# Patient Record
Sex: Male | Born: 1990 | Race: Black or African American | Hispanic: No | Marital: Single | Smoking: Current every day smoker
Health system: Southern US, Community
[De-identification: ages and names within clinical notes are randomized; demographics above are authoritative.]

## PROBLEM LIST (undated history)

## (undated) DIAGNOSIS — I10 Essential (primary) hypertension: Secondary | ICD-10-CM

---
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
## 2010-10-22 ENCOUNTER — Encounter: Payer: Self-pay | Admitting: *Deleted

## 2014-05-19 ENCOUNTER — Emergency Department (INDEPENDENT_AMBULATORY_CARE_PROVIDER_SITE_OTHER)
Admission: EM | Admit: 2014-05-19 | Discharge: 2014-05-19 | Disposition: A | Discharge status: Home / Self Care | Payer: Self-pay | Source: Home / Self Care | Attending: Emergency Medicine | Admitting: Emergency Medicine

## 2014-05-19 ENCOUNTER — Encounter (HOSPITAL_COMMUNITY): Payer: Self-pay | Admitting: Emergency Medicine

## 2014-05-19 DIAGNOSIS — L237 Allergic contact dermatitis due to plants, except food: Secondary | ICD-10-CM

## 2014-05-19 DIAGNOSIS — L255 Unspecified contact dermatitis due to plants, except food: Secondary | ICD-10-CM

## 2014-05-19 MED ORDER — HYDROXYZINE HCL 25 MG PO TABS: 25.0000 mg | ORAL_TABLET | Freq: Four times a day (QID) | ORAL | Status: DC | PRN

## 2014-05-19 MED ORDER — PREDNISONE 20 MG PO TABS: ORAL_TABLET | ORAL | Status: DC

## 2014-05-19 MED ORDER — METHYLPREDNISOLONE ACETATE 80 MG/ML IJ SUSP
INTRAMUSCULAR | Status: AC
  Filled 2014-05-19: qty 1

## 2014-05-19 MED ORDER — TRIAMCINOLONE ACETONIDE 0.1 % EX CREA: TOPICAL_CREAM | CUTANEOUS | Status: DC

## 2014-05-19 MED ORDER — HYDROCORTISONE 1 % EX CREA: TOPICAL_CREAM | CUTANEOUS | Status: DC

## 2014-05-19 MED ORDER — METHYLPREDNISOLONE ACETATE 80 MG/ML IJ SUSP
80.0000 mg | Freq: Once | INTRAMUSCULAR | Status: AC
  Administered 2014-05-19: 80 mg via INTRAMUSCULAR

## 2014-05-19 NOTE — ED Provider Notes (Signed)
  Chief Complaint    Chief Complaint  Patient presents with  . Rash    History of Present Illness      Lance Williams is a 23 year old male who was exposed to poison ivy 4 days ago. 3 days ago he began to break out in a rash on his right arm and on his face. This is moderately pruritic. The rash on the face is around the eyes, although he has no problem with vision. He denies any fever, chills, difficulty breathing, or swelling of lips, tongue, or throat.  Review of Systems   Other than as noted above, the patient denies any of the following symptoms: Systemic:  No fever or chills. ENT:  No nasal congestion, rhinorrhea, sore throat, swelling of lips, tongue or throat. Resp:  No cough, wheezing, or shortness of breath.  PMFSH    Past medical history, family history, social history, meds, and allergies were reviewed. He has mild high blood pressure but is not taking any medication for it right now.  Physical Exam     Vital signs:  BP 149/92  Pulse 68  Temp(Src) 98.3 F (36.8 C) (Oral)  Resp 14  SpO2 100% Gen:  Alert, oriented, in no distress. ENT:  Pharynx clear, no intraoral lesions, moist mucous membranes. Lungs:  Clear to auscultation. Skin:  He has recent patches of maculopapules and vesicles on his right arm and on his face,he on the right upper and lower lids. Skin was otherwise clear.  Course in [facility]     The following meds were given:  Medications  methylPREDNISolone acetate (DEPO-MEDROL) injection 80 mg (not administered)   Assessment    The encounter diagnosis was Poison ivy.  Plan     1.  Meds:  The following meds were prescribed:   New Prescriptions   HYDROCORTISONE CREAM 1 %    Apply to rash on face TID   HYDROXYZINE (ATARAX/VISTARIL) 25 MG TABLET    Take 1 tablet (25 mg total) by mouth every 6 (six) hours as needed for itching.   PREDNISONE (DELTASONE) 20 MG TABLET    Take 3 daily for 5 days, 2 daily for 5 days, 1 daily for 5 days.   TRIAMCINOLONE CREAM (KENALOG) 0.1 %    Apply to rash on body TID    2.  Patient Education/Counseling:  The patient was given appropriate handouts, self care instructions, and instructed in symptomatic relief.    3.  Follow up:  The patient was told to follow up here if no better in 3 to 4 days, or sooner if becoming worse in any way, and given some red flag symptoms such as worsening rash, fever, or difficulty breathing which would prompt immediate return.  Follow up here if necessary.      Reuben Likes, MD 05/19/14 [phone]

## 2014-05-19 NOTE — Discharge Instructions (Signed)

## 2014-05-19 NOTE — ED Notes (Signed)
Rash on arms.  Noticed 1 wk ago.   No otc treatments tried.

## 2014-07-14 ENCOUNTER — Ambulatory Visit: Payer: Self-pay

## 2014-10-04 ENCOUNTER — Encounter (HOSPITAL_COMMUNITY): Payer: Self-pay | Admitting: Neurology

## 2014-10-04 ENCOUNTER — Emergency Department (HOSPITAL_COMMUNITY)
Admission: EM | Admit: 2014-10-04 | Discharge: 2014-10-04 | Disposition: A | Discharge status: Home / Self Care | Payer: Self-pay | Attending: Emergency Medicine | Admitting: Emergency Medicine

## 2014-10-04 DIAGNOSIS — Z87891 Personal history of nicotine dependence: Secondary | ICD-10-CM | POA: Insufficient documentation

## 2014-10-04 DIAGNOSIS — Z113 Encounter for screening for infections with a predominantly sexual mode of transmission: Secondary | ICD-10-CM

## 2014-10-04 DIAGNOSIS — J029 Acute pharyngitis, unspecified: Secondary | ICD-10-CM

## 2014-10-04 DIAGNOSIS — Z7982 Long term (current) use of aspirin: Secondary | ICD-10-CM | POA: Insufficient documentation

## 2014-10-04 LAB — RAPID STREP SCREEN (MED CTR MEBANE ONLY): Streptococcus, Group A Screen (Direct): NEGATIVE

## 2014-10-04 LAB — RAPID HIV SCREEN (WH-MAU): SUDS RAPID HIV SCREEN: NONREACTIVE

## 2014-10-04 LAB — MONONUCLEOSIS SCREEN: Mono Screen: NEGATIVE

## 2014-10-04 NOTE — ED Notes (Signed)
Lab in to draw blood.

## 2014-10-04 NOTE — ED Provider Notes (Signed)
CSN: [id number]     Arrival date & time 10/04/14  [id number] History   First MD Initiated Contact with Patient 10/04/14 1004     Chief Complaint  Patient presents with  . URI     (Consider location/radiation/quality/duration/timing/severity/associated sxs/prior Treatment) HPI Lance Williams is a 24 y.o. male with no medical problems, presents to emergency department complaining of sore throat. Patient reports sore throat for several days. States he feels like his tonsils are swollen. He has been taking aspirin and DayQuil with no relief. Patient also requesting STD screening. States he had intercourse with someone who tested positive for chlamydia 2 weeks ago, and he is worried he may have gotten HIV. They did not use protection. He denies any penile discharge, dysuria. He states he has had subjective fevers, denies any nasal congestion, cough. No recent ill contacts. Patient does not have a family doctor.  History reviewed. No pertinent past medical history. History reviewed. No pertinent past surgical history. No family history on file. History  Substance Use Topics  . Smoking status: Former Games developermoker  . Smokeless tobacco: Not on file  . Alcohol Use: No    Review of Systems  Constitutional: Negative for fever and chills.  HENT: Positive for sore throat. Negative for congestion.   Respiratory: Negative for cough, chest tightness and shortness of breath.   Cardiovascular: Negative for chest pain, palpitations and leg swelling.  Gastrointestinal: Negative for nausea, vomiting, abdominal pain, diarrhea and abdominal distention.  Genitourinary: Negative for dysuria, urgency, frequency, hematuria, discharge, penile swelling, penile pain and testicular pain.  Musculoskeletal: Negative for myalgias, arthralgias, neck pain and neck stiffness.  Skin: Negative for rash.  Allergic/Immunologic: Negative for immunocompromised state.  Neurological: Negative for weakness and headaches.       Allergies  Review of patient's allergies indicates no known allergies.  Home Medications   Prior to Admission medications   Medication Sig Start Date End Date Taking? Authorizing Provider  ASPIRIN PO Take 2 tablets by mouth daily as needed (pain).   Yes Historical Provider, MD  hydrocortisone cream 1 % Apply to rash on face TID Patient not taking: Reported on 10/04/2014 05/19/14   Reuben Likesavid C Keller, MD  hydrOXYzine (ATARAX/VISTARIL) 25 MG tablet Take 1 tablet (25 mg total) by mouth every 6 (six) hours as needed for itching. Patient not taking: Reported on 10/04/2014 05/19/14   Reuben Likesavid C Keller, MD  predniSONE (DELTASONE) 20 MG tablet Take 3 daily for 5 days, 2 daily for 5 days, 1 daily for 5 days. Patient not taking: Reported on 10/04/2014 05/19/14   Reuben Likesavid C Keller, MD  triamcinolone cream (KENALOG) 0.1 % Apply to rash on body TID Patient not taking: Reported on 10/04/2014 05/19/14   Reuben Likesavid C Keller, MD   BP 148/100 mmHg  Pulse 79  Temp(Src) 97.5 F (36.4 C) (Oral)  Resp 20  SpO2 99% Physical Exam  Constitutional: He is oriented to person, place, and time. He appears well-developed and well-nourished. No distress.  HENT:  Head: Normocephalic and atraumatic.  Right Ear: External ear normal.  Left Ear: External ear normal.  Mouth/Throat: Oropharynx is clear and moist.  Clear rhinorrhea, pharynx erythemous, tonsils enlarged, no exudate, uvula midline  Eyes: Conjunctivae are normal.  Neck: Normal range of motion. Neck supple.  No meningeal signs  Cardiovascular: Normal rate, regular rhythm and normal heart sounds.   Pulmonary/Chest: Effort normal and breath sounds normal. No respiratory distress. He has no wheezes. He has no rales.  Abdominal: Soft. Bowel sounds  are normal. There is no tenderness.  Genitourinary: Penis normal. No penile tenderness.  Musculoskeletal: He exhibits no edema or tenderness.  Lymphadenopathy:    He has no cervical adenopathy.  Neurological: He is alert and  oriented to person, place, and time.  Skin: Skin is warm and dry. No erythema.  Psychiatric: He has a normal mood and affect.  Nursing note and vitals reviewed.   ED Course  Procedures (including critical care time) Labs Review Labs Reviewed  RAPID STREP SCREEN  RPR  RAPID HIV SCREEN ([facility])  MONONUCLEOSIS SCREEN  GC/CHLAMYDIA PROBE AMP ([place])    Imaging Review No results found.   EKG Interpretation None      MDM   Final diagnoses:  Pharyngitis  Screening for STD (sexually transmitted disease)     patient is here with sore throat, once STD panel done. STD panel obtained including RPR, HIV, GC and chlamydia. Patient strep and Monospot are negative. Most likely viral pharyngitis. Will discharge home with dramatic treatment. No evidence of peritonsillar abscess. Vital signs are normal except for mildly elevated blood pressure, will discharge home with outpatient follow-up.  Filed Vitals:   10/04/14 1031 10/04/14 1115 10/04/14 1200 10/04/14 1243  BP: 148/100 138/101 148/98 148/98  Pulse: 79 74 75 79  Temp:    99.5 F (37.5 C)  TempSrc:    Oral  Resp:    16  SpO2: 99% 97% 99% 99%      Lottie Mussel, PA-C 10/04/14 1620  Rolan Bucco, MD 10/05/14 [phone]

## 2014-10-04 NOTE — Discharge Instructions (Signed)
Tylenol or motrin for pain. Salt water gargles. No intercourse. Follow up with your doctor or health dept for further tx and recheck. Your cultures for gonorrhea and clamydia are pending and if come back positive we will call you   Pharyngitis Pharyngitis is redness, pain, and swelling (inflammation) of your pharynx.  CAUSES  Pharyngitis is usually caused by infection. Most of the time, these infections are from viruses (viral) and are part of a cold. However, sometimes pharyngitis is caused by bacteria (bacterial). Pharyngitis can also be caused by allergies. Viral pharyngitis may be spread from person to person by coughing, sneezing, and personal items or utensils (cups, forks, spoons, toothbrushes). Bacterial pharyngitis may be spread from person to person by more intimate contact, such as kissing.  SIGNS AND SYMPTOMS  Symptoms of pharyngitis include:   Sore throat.   Tiredness (fatigue).   Low-grade fever.   Headache.  Joint pain and muscle aches.  Skin rashes.  Swollen lymph nodes.  Plaque-like film on throat or tonsils (often seen with bacterial pharyngitis). DIAGNOSIS  Your health care provider will ask you questions about your illness and your symptoms. Your medical history, along with a physical exam, is often all that is needed to diagnose pharyngitis. Sometimes, a rapid strep test is done. Other lab tests may also be done, depending on the suspected cause.  TREATMENT  Viral pharyngitis will usually get better in 3-4 days without the use of medicine. Bacterial pharyngitis is treated with medicines that kill germs (antibiotics).  HOME CARE INSTRUCTIONS   Drink enough water and fluids to keep your urine clear or pale yellow.   Only take over-the-counter or prescription medicines as directed by your health care provider:   If you are prescribed antibiotics, make sure you finish them even if you start to feel better.   Do not take aspirin.   Get lots of rest.    Gargle with 8 oz of salt water ( tsp of salt per 1 qt of water) as often as every 1-2 hours to soothe your throat.   Throat lozenges (if you are not at risk for choking) or sprays may be used to soothe your throat. SEEK MEDICAL CARE IF:   You have large, tender lumps in your neck.  You have a rash.  You cough up green, yellow-brown, or bloody spit. SEEK IMMEDIATE MEDICAL CARE IF:   Your neck becomes stiff.  You drool or are unable to swallow liquids.  You vomit or are unable to keep medicines or liquids down.  You have severe pain that does not go away with the use of recommended medicines.  You have trouble breathing (not caused by a stuffy nose). MAKE SURE YOU:   Understand these instructions.  Will watch your condition.  Will get help right away if you are not doing well or get worse. Document Released: 09/02/2005 Document Revised: 06/23/2013 Document Reviewed: 05/10/2013 [facility] Patient Information 2015 [place], [place]. This information is not intended to replace advice given to you by your health care provider. Make sure you discuss any questions you have with your health care provider.

## 2014-10-04 NOTE — ED Notes (Signed)
Complains of sore throat, sweating and exposure to chlamydia. States he thinks he may have some fever at home. Did not take temp. Denies n/v/d. States he is eating and drinking well. States his tonsils feel swollen and painful. He also is requesting to be hiv tested because he had unprotected sex with someone that had chlymidia about 2 weeks ago,, he denies any painful urination or penile discharge.

## 2014-10-04 NOTE — ED Notes (Signed)
Pt requesting HIV test, denies s/s  Just reports he wants the test. Also has cold, fever, swollen tonsils for several days. States BP has been high. Pt is a x 4.

## 2014-10-05 LAB — GC/CHLAMYDIA PROBE AMP (~~LOC~~) NOT AT ARMC
Chlamydia: NEGATIVE
NEISSERIA GONORRHEA: POSITIVE — AB

## 2014-10-05 LAB — RPR: RPR: NONREACTIVE

## 2014-10-06 LAB — CULTURE, GROUP A STREP

## 2014-10-07 ENCOUNTER — Emergency Department (HOSPITAL_COMMUNITY): Payer: Self-pay

## 2014-10-07 ENCOUNTER — Encounter (HOSPITAL_COMMUNITY): Payer: Self-pay

## 2014-10-07 ENCOUNTER — Emergency Department (HOSPITAL_COMMUNITY)
Admission: EM | Admit: 2014-10-07 | Discharge: 2014-10-07 | Disposition: A | Discharge status: Home / Self Care | Payer: Self-pay | Attending: Emergency Medicine | Admitting: Emergency Medicine

## 2014-10-07 DIAGNOSIS — J039 Acute tonsillitis, unspecified: Secondary | ICD-10-CM

## 2014-10-07 DIAGNOSIS — N289 Disorder of kidney and ureter, unspecified: Secondary | ICD-10-CM | POA: Insufficient documentation

## 2014-10-07 DIAGNOSIS — A549 Gonococcal infection, unspecified: Secondary | ICD-10-CM | POA: Insufficient documentation

## 2014-10-07 DIAGNOSIS — R61 Generalized hyperhidrosis: Secondary | ICD-10-CM | POA: Insufficient documentation

## 2014-10-07 DIAGNOSIS — R000 Tachycardia, unspecified: Secondary | ICD-10-CM | POA: Insufficient documentation

## 2014-10-07 DIAGNOSIS — J0300 Acute streptococcal tonsillitis, unspecified: Secondary | ICD-10-CM | POA: Insufficient documentation

## 2014-10-07 DIAGNOSIS — Z87891 Personal history of nicotine dependence: Secondary | ICD-10-CM | POA: Insufficient documentation

## 2014-10-07 DIAGNOSIS — Z7982 Long term (current) use of aspirin: Secondary | ICD-10-CM | POA: Insufficient documentation

## 2014-10-07 HISTORY — DX: Essential (primary) hypertension: I10

## 2014-10-07 LAB — I-STAT CHEM 8, ED
BUN: 15 mg/dL (ref 6–23)
CREATININE: 1.7 mg/dL — AB (ref 0.50–1.35)
Calcium, Ion: 1.11 mmol/L — ABNORMAL LOW (ref 1.12–1.23)
Chloride: 101 mmol/L (ref 96–112)
Glucose, Bld: 136 mg/dL — ABNORMAL HIGH (ref 70–99)
HEMATOCRIT: 52 % (ref 39.0–52.0)
Hemoglobin: 17.7 g/dL — ABNORMAL HIGH (ref 13.0–17.0)
Potassium: 4 mmol/L (ref 3.5–5.1)
Sodium: 139 mmol/L (ref 135–145)
TCO2: 24 mmol/L (ref 0–100)

## 2014-10-07 MED ORDER — PREDNISONE (PAK) 10 MG PO TABS: ORAL_TABLET | Freq: Every day | ORAL | Status: DC

## 2014-10-07 MED ORDER — IBUPROFEN 800 MG PO TABS: 800.0000 mg | ORAL_TABLET | Freq: Three times a day (TID) | ORAL | Status: DC | PRN

## 2014-10-07 MED ORDER — CEFTRIAXONE SODIUM 250 MG IJ SOLR
250.0000 mg | Freq: Once | INTRAMUSCULAR | Status: AC
  Administered 2014-10-07: 250 mg via INTRAMUSCULAR
  Filled 2014-10-07: qty 250

## 2014-10-07 MED ORDER — HYDROMORPHONE HCL 1 MG/ML IJ SOLN
1.0000 mg | Freq: Once | INTRAMUSCULAR | Status: AC
  Administered 2014-10-07: 1 mg via INTRAVENOUS
  Filled 2014-10-07: qty 1

## 2014-10-07 MED ORDER — PENICILLIN G BENZATHINE 1200000 UNIT/2ML IM SUSP
1.2000 10*6.[IU] | Freq: Once | INTRAMUSCULAR | Status: AC
  Administered 2014-10-07: 1.2 10*6.[IU] via INTRAMUSCULAR
  Filled 2014-10-07: qty 2

## 2014-10-07 MED ORDER — SODIUM CHLORIDE 0.9 % IV BOLUS (SEPSIS)
1000.0000 mL | Freq: Once | INTRAVENOUS | Status: AC
  Administered 2014-10-07: 1000 mL via INTRAVENOUS

## 2014-10-07 MED ORDER — IOHEXOL 300 MG/ML  SOLN
75.0000 mL | Freq: Once | INTRAMUSCULAR | Status: AC | PRN
  Administered 2014-10-07: 75 mL via INTRAVENOUS

## 2014-10-07 MED ORDER — PENICILLIN G BENZATHINE & PROC 1200000 UNIT/2ML IM SUSP
1.2000 10*6.[IU] | Freq: Once | INTRAMUSCULAR | Status: DC
  Filled 2014-10-07: qty 2

## 2014-10-07 MED ORDER — OXYCODONE-ACETAMINOPHEN 5-325 MG PO TABS: 1.0000 | ORAL_TABLET | ORAL | Status: DC | PRN

## 2014-10-07 MED ORDER — DEXAMETHASONE SODIUM PHOSPHATE 10 MG/ML IJ SOLN
10.0000 mg | Freq: Once | INTRAMUSCULAR | Status: AC
  Administered 2014-10-07: 10 mg via INTRAVENOUS
  Filled 2014-10-07: qty 1

## 2014-10-07 NOTE — Discharge Instructions (Signed)
Read the information below.  Use the prescribed medication as directed.  Please discuss all new medications with your pharmacist.  Do not take additional tylenol while taking the prescribed pain medication to avoid overdose.  You may return to the Emergency Department at any time for worsening condition or any new symptoms that concern you.  If you develop high fevers, difficulty swallowing or breathing, or you are unable to tolerate fluids by mouth, return to the ER immediately for a recheck.      Emergency Department Resource Guide 1) Find a Doctor and Pay Out of Pocket Although you won't have to find out who is covered by your insurance plan, it is a good idea to ask around and get recommendations. You will then need to call the office and see if the doctor you have chosen will accept you as a new patient and what types of options they offer for patients who are self-pay. Some doctors offer discounts or will set up payment plans for their patients who do not have insurance, but you will need to ask so you aren't surprised when you get to your appointment.  2) Contact Your Local Health Department Not all health departments have doctors that can see patients for sick visits, but many do, so it is worth a call to see if yours does. If you don't know where your local health department is, you can check in your phone book. The CDC also has a tool to help you locate your state's health department, and many state websites also have listings of all of their local health departments.  3) Find a Walk-in Clinic If your illness is not likely to be very severe or complicated, you may want to try a walk in clinic. These are popping up all over the country in pharmacies, drugstores, and shopping centers. They're usually staffed by nurse practitioners or physician assistants that have been trained to treat common illnesses and complaints. They're usually fairly quick and inexpensive. However, if you have serious  medical issues or chronic medical problems, these are probably not your best option.  No Primary Care Doctor: - Call Health Connect at  [phone] - they can help you locate a primary care doctor that  accepts your insurance, provides certain services, etc. - Physician Referral Service- [phone]  Chronic Pain Problems: Organization         Address  Phone   Notes  [facility]  [phone] Patients need to be referred by their primary care doctor.   Medication Assistance: Organization         Address  Phone   Notes  [facility] Medication [facility] [address] [place], [place] [address] [phone] --Must be a resident of [place] [facility] [place] -- Must have NO insurance coverage whatsoever (no Medicaid/ Medicare, etc.) -- The pt. MUST have a primary care doctor that directs their care regularly and follows them in the community   MedAssist  [phone]   [employer]  [phone]    Agencies that provide inexpensive medical care: Organization         Address  Phone   Notes  [facility]  [phone]   [facility]    [phone]   [facility] [address] [place], [place] [address] [phone]   [facility] [address], [place] [phone]   [facility]anned Parenthood    [phone]) [phone]  [facility]    [phone]   [facility]  [address], [place] Phone:  [phone], Fax:  [phone] Hours of Operation:  9 am - 6 pm, M-F.  Also accepts Medicaid/Medicare and self-pay.  [facility] for [place] [address], [address], [place] Phone: [phone], Fax: [phone]. Hours of Operation:  8:30 am - 5:30 pm, M-F.  Also accepts Medicaid and self-pay.  [facility] [place] [address] [place] Phone: [phone]   [place], [place], [place] [phone], Ext. [phone] Mondays & Thursdays: 7-9 AM.  First 15 patients are seen on a first come, first serve basis.    [place] Providers:  Organization         Address  Phone   Notes  [facility] [address], [place] [phone] Also accepts self-pay patients.  [facility] [id number] [place], [place]  [phone]   [place], [address], [place] [phone]   [facility] [address], [place] [phone]   Lucianne Lei [address], [place]   [phone] Only accepts [place] Access [place] patients after they have their name applied to their card.   Self-Pay (no insurance) in [facility]:  Organization         Address  Phone   Notes  Sickle Cell Patients, [facility] Internal [facility] [place] [phone]   [facility] [place] [phone]   [facility] [place]  [place], [place], [place] [phone]   [facility]/Dr. Osei-Bonsu  [address] [place] or [address], [place] [phone] Phone number for both [place] and [place] locations is the same.  Urgent Medical and [facility] [address], [place] [phone]   [facility] [address], [place] or [address] [phone]   [facility] [address] [place] [phone], phone; [phone], fax Sees patients 1st and 3rd Saturday of every month.  Must not qualify for public or private insurance (i.e. Medicaid, Medicare, [place] Health Choice, Veterans' Benefits)  Household income should be no more than 200% of the poverty level The clinic cannot treat you if you are pregnant or think you are pregnant  Sexually  transmitted diseases are not treated at the clinic.    Dental Care: Organization         Address  Phone  Notes  [facility] Department of [facility] [place] [phone] Accepts children up to age 77 who are enrolled in [place] or [place]; pregnant women with a Medicaid card; and children who have applied for Medicaid or [place] Health Choice, but were declined, whose parents can pay a reduced fee at time of service.  [facility] Department of [facility]  [address], [place] [phone] Accepts children up to age 66 who are enrolled in [place] or [place]; pregnant women with a Medicaid card; and children who have applied for Medicaid or [place] Health Choice, but were declined, whose parents can pay a reduced fee at time  of service.  [place] [facility] Access PROGRAM  [place] [phone] Patients are seen by appointment only. Walk-ins are not accepted. [place] will see patients 4 years of age and older. Monday - Tuesday (8am-5pm) Most Wednesdays (8:30-5pm) $30 per visit, cash only  [facility] Adult Dental Access PROGRAM  [address], [facility] [phone] Patients are seen by appointment only. Walk-ins are not accepted. [place] will see patients 33 years of age and older. One Wednesday Evening (Monthly: Volunteer Based).  $30 per visit, cash only  [place]  [phone] for adults; Children under age 60, call Graduate Pediatric Dentistry at [phone]. Children aged 7-14, please call [phone] to request a pediatric application.  Dental services are provided in all areas of dental care including fillings, crowns and bridges, complete and partial dentures, implants, gum treatment, root canals, and extractions. Preventive care is also provided. Treatment is provided to both adults and children. Patients are  selected via a lottery and there is often a waiting list.   [facility] [address] [place]  [phone] [email or web address]   [facility] [address] [place], [place] [phone], Ext. [phone] Second and Fourth Thursday of each month, opens at 6:30 AM; Clinic ends at 9 AM.  Patients are seen on a first-come first-served basis, and a limited number are seen during each clinic.   [facility]  [address] Hillard Danker [place], [place] [phone]   Eligibility Requirements You must have lived in [place], [place], or [place] counties for at least the last three months.   You cannot be eligible for state or federal sponsored [employer], including [employer], [place], or [employer].   You generally cannot be eligible for healthcare insurance through your employer.    How to apply: Eligibility screenings are held every Tuesday and Wednesday afternoon from 1:00 pm until 4:00 pm. You do not need an appointment for the interview!  [facility] [address] [place], [place]   [place]  [place] Department  [place]  [phone]    Behavioral Health Resources in the Community: Intensive Outpatient Programs Organization         Address  Phone  Notes  [place]. [address] [place], [place] [phone]   [facility] [address] [place], [place]   ADS: Alcohol & Drug Svcs [address] [place], [place]   [place] [address]  [place], [place] or [phone]   Substance Abuse Resources Organization         Address  Phone  Notes  Alcohol and Drug Services  [phone]   [place]  [phone]   The [place]   Chinita Pester  [phone]     Residential & Outpatient Substance Abuse Program  [phone]   Psychological Services Organization         Address  Phone  Notes  [facility]  [place]  [phone]   [place] [address], [place] or [phone]    Mobile Crisis Teams Organization         Address  Phone  Notes  [facility], Mobile Crisis Care Unit  [phone]   Assertive  Psychotherapeutic Services  [address] [place], [place]   [facility] [address], [place] [phone]    Self-Help/Support Groups Organization         Address  Phone             Notes  Mental Health Assoc. of [place] - variety of support groups  [place] Call for more information  Narcotics Anonymous (NA), [facility]aring [facility] [address], [employer] [place]  2 meetings at this location   Special educational needs teacher         Address  Phone  Notes  ASAP Residential Treatment [place],    [place]  [phone]   [facility]  [address], [place] [phone], [place], [place]   [place], [place] [phone] Admissions: 8am-3pm M-F  Incentives Substance Jadence [place] [address],    [place], [place] [phone]   [facility] [address] [place], [place], [place]   [facility] [address]  [place], [place]   [facility] - Intensive Outpatient [address]een [address], [place], [place]   [facility] ([place].,  [place], [place] [phone] or [phone]   [facility] Services (RTS) [address] [place], [place] Accepts Medicaid  [facility] [place] [address]  [place] [phone] Substance Abuse/Addiction Treatment   [facility] Organization         Address  Phone  Notes  [facility]  [phone]   Domenic Schwab, PhD [address] Arlis [place], [place]   [phone] or [phone]   [place], [place] [phone]   [facility] [address] [place], [place] [phone] Insurance/Medicaid/sponsorship through [facility] and Families [address] [place]                                    [employer], [place] [phone] [place] [address] [place], [place] [phone]    Dr. Adele Schilder  [phone]   [facility] Dept. 1) [address] [place] 2) [place] 3)  [place] [address], [place] [phone]  [phone]   [place] [phone] or [phone] (After Hours)

## 2014-10-07 NOTE — ED Provider Notes (Signed)
CSN: [id number]     Arrival date & time 10/07/14  1234 History   First MD Initiated Contact with Patient 10/07/14 1249     No chief complaint on file.    (Consider location/radiation/quality/duration/timing/severity/associated sxs/prior Treatment) HPI   Pt seen in ED 3 days ago for sore throat and STD screening p/w worsening sore throat.  Has been taking tylenol and doing salt water gargles without improvement.  Has now been sick for 1-1.5 weeks and is having difficulty eating and drinking due to pain.  Pain is constant, sharp, 9/10, worse with swallowing.  Associated chills/sweats.  He was screened for STDs because he had unprotected sex with a new partner - states he did engage oral sex with this person approximately 2 weeks ago.  Found to be positive for gonorrhea, not yet treated.  Strep culture is positive, has not yet been treated.  Denies cough, SOB, difficulty breathing or swallowing, N/V, abdominal pain, penile discharge, testicular pain or swelling.    History reviewed. No pertinent past medical history. History reviewed. No pertinent past surgical history. History reviewed. No pertinent family history. History  Substance Use Topics  . Smoking status: Former Games developer  . Smokeless tobacco: Not on file  . Alcohol Use: No    Review of Systems  Constitutional: Positive for fever, chills and diaphoresis.  HENT: Positive for sore throat and voice change. Negative for trouble swallowing.   Respiratory: Negative for shortness of breath.   Allergic/Immunologic: Negative for immunocompromised state.  All other systems reviewed and are negative.     Allergies  Review of patient's allergies indicates no known allergies.  Home Medications   Prior to Admission medications   Medication Sig Start Date End Date Taking? Authorizing Provider  ASPIRIN PO Take 2 tablets by mouth daily as needed (pain).    Historical Provider, MD  hydrocortisone cream 1 % Apply to rash on face TID Patient  not taking: Reported on 10/04/2014 05/19/14   Reuben Likes, MD  hydrOXYzine (ATARAX/VISTARIL) 25 MG tablet Take 1 tablet (25 mg total) by mouth every 6 (six) hours as needed for itching. Patient not taking: Reported on 10/04/2014 05/19/14   Reuben Likes, MD  predniSONE (DELTASONE) 20 MG tablet Take 3 daily for 5 days, 2 daily for 5 days, 1 daily for 5 days. Patient not taking: Reported on 10/04/2014 05/19/14   Reuben Likes, MD  triamcinolone cream (KENALOG) 0.1 % Apply to rash on body TID Patient not taking: Reported on 10/04/2014 05/19/14   Reuben Likes, MD   BP 157/86 mmHg  Pulse 115  Temp(Src) 99.2 F (37.3 C) (Oral)  Resp 15  Ht  (1.753 m)  SpO2 97% Physical Exam  Constitutional: He appears well-developed and well-nourished. No distress.  HENT:  Head: Normocephalic and atraumatic.  Mouth/Throat: Uvula is midline. Mucous membranes are not dry. No uvula swelling. Oropharyngeal exudate, posterior oropharyngeal edema and posterior oropharyngeal erythema present.  Bilateral tonsils enlarged, asymmetric, R>L Tolerating oral secretions without difficulty +voice change  Eyes: Conjunctivae are normal.  Neck: Normal range of motion. Neck supple.  Cardiovascular: Regular rhythm.  Tachycardia present.   Pulmonary/Chest: Effort normal and breath sounds normal. No stridor. No respiratory distress. He has no wheezes. He has no rales.  Abdominal: Soft. He exhibits no distension. There is no tenderness. There is no rebound and no guarding.  Lymphadenopathy:    He has cervical adenopathy.  Neurological: He is alert.  Skin: He is not diaphoretic.  Psychiatric: He  has a normal mood and affect. His behavior is normal.  Nursing note and vitals reviewed.   ED Course  Procedures (including critical care time) Labs Review Labs Reviewed  I-STAT CHEM 8, ED - Abnormal; Notable for the following:    Creatinine, Ser 1.70 (*)    Glucose, Bld 136 (*)    Calcium, Ion 1.11 (*)    Hemoglobin 17.7 (*)     All other components within normal limits    Imaging Review Ct Soft Tissue Neck W Contrast  10/07/2014   CLINICAL DATA:  24 year old male with sore throat and dysphagia  EXAM: CT NECK WITH CONTRAST  TECHNIQUE: Multidetector CT imaging of the neck was performed using the standard protocol following the bolus administration of intravenous contrast.  CONTRAST:  75mL OMNIPAQUE IOHEXOL 300 MG/ML  SOLN  COMPARISON:  None.  FINDINGS: Pharynx and larynx: The larynx is unremarkable. Evaluation of the pharynx demonstrates marked hypertrophy of the bilateral faucial tonsils. Small speckled areas of low attenuation throughout both tonsils consistent with suppurative tonsillitis. The largest individual fluid collection is located on the left and measures 13 x 12 mm.  Salivary glands: Within normal limits.  Thyroid: Within normal limits.  Lymph nodes: Mild bilateral reactive cervical adenopathy. No suspicious lymph nodes identified.  Vascular: Within normal limits.  Limited intracranial: Unremarkable.  Mastoids and visualized paranasal sinuses: Mild mucoperiosteal thickening in the inferior aspect of the left maxillary sinus. Otherwise, unremarkable.  Skeleton: Within normal limits.  Upper chest: Unremarkable.  IMPRESSION: 1. The CT findings are most consistent with acute suppurative tonsillitis. There are multiple small low attenuation collections throughout both tonsils consistent with diffuse phlegmon or tiny abscesses. The largest single fluid collection on the left measures 13 x 12 mm. 2. Likely reactive bilateral cervical adenopathy.   Electronically Signed   By: Malachy Moan M.D.   On: 10/07/2014 14:01     EKG Interpretation None       I have discussed the patient and CT scan with Dr Madilyn Hook who has also seen and examined the patient.  I have spoken with the ED pharmacists regarding treatment of gonococcal pharyngitis, they will consult the ID pharmacist.    2:36 PM Discussed pt with Dr Lazarus Salines who will  review the CT and return my call.    Dr Lazarus Salines recommends treating as tonsillitis with antibiotics.  No need for surgical intervention at this time.    3:54 PM Pt reports feeling much better after IVF, pain medications ,decadron, antibiotics.  Tolerating PO easily.  Discussed liquid vs pill pain medications for home and pt prefers pills (no current insurance).    MDM   Final diagnoses:  Acute suppurative tonsillitis  Renal insufficiency  Gonorrhea  Strep tonsillitis    Afebrile, nontoxic patient with pharyngitis with subjective fevers, erythema, edema, exudate, asymmetric tonsils.  Recent positive gonorrhea test and pt did engage in oral sex with this encounter.  Treated for gonorrhea with IM rocephin.  Recent results also show culture positive for strep.  Given IVF, pain medication.  Pt tolerating oral secretions without difficulty.  Mono test negative.  STD/HIV tests otherwise negative.  CT neck w contrast shows suppurative tonsillitis with diffuse phlegmon and tiny abscesses, largest measuring 12x4mm.  Discussed patient and CT results with Dr Lazarus Salines who recommends treatment as tonsillitis with antibiotics.  Discussed treatment of possible gonococcal pharyngitis given timing of symptoms and ID pharmacist recommended treating as strep pharyngitis and treating for gonorrhea infection with standard treatment.   Pt advised  all sexual partners should be tested and treated.  D/C home with percocet, prednisone, ibuprofen.  PCP resources for follow up to recheck kidney function.  ENT follow up.  Discussed strict return precautions.  Discussed result, findings, treatment, and follow up  with patient.  Pt given return precautions.  Pt verbalizes understanding and agrees with plan.         Trixie Dredgemily Rickesha Veracruz, PA-C 10/07/14 1555  Tilden FossaElizabeth Rees, MD 10/10/14 [phone]

## 2014-10-07 NOTE — ED Notes (Signed)
Pt presents with continued sore throat x 1 week.  Pt seen here for same on 1/19, reports inability to eat or drink.  Pt requesting abx.

## 2014-10-13 ENCOUNTER — Telehealth (HOSPITAL_BASED_OUTPATIENT_CLINIC_OR_DEPARTMENT_OTHER): Payer: Self-pay | Admitting: Emergency Medicine

## 2014-10-13 NOTE — Telephone Encounter (Signed)
Post ED Visit - Positive Culture Follow-up: Chart Hand-off to ED Flow Manager  Culture assessed and recommendations reviewed by: []  Wes Dulaney, Pharm.D., BCPS []  Celedonio MiyamotoJeremy Frens, Pharm.D., BCPS []  Georgina PillionElizabeth Martin, Pharm.D., BCPS []  [place] Lake[place]h Pham, [address] BoulevardPharm.D., BCPS, AAHIVP []  Estella HuskMichelle Turner, Pharm .D., BCPS, AAHIVP []  Babs BertinHaley Baird, [address] BoulevardPharm.D.  Positive GC  culture  []  Patient discharged without antimicrobial prescription and treatment is now indicated [x]  Organism is resistant to prescribed ED discharge antimicrobial []  Patient with positive blood cultures  Chart handoff to EDP 10/13/14   Berle MullMiller, Mckala Pantaleon 10/13/2014, 4:48 PM

## 2014-10-18 ENCOUNTER — Telehealth (HOSPITAL_BASED_OUTPATIENT_CLINIC_OR_DEPARTMENT_OTHER): Payer: Self-pay | Admitting: Emergency Medicine

## 2014-10-31 ENCOUNTER — Telehealth (HOSPITAL_COMMUNITY): Payer: Self-pay

## 2014-10-31 NOTE — ED Notes (Signed)
Unable to contact pt by mail or telephone. Unable to communicate lab results or treatment changes. 

## 2016-02-06 ENCOUNTER — Emergency Department (HOSPITAL_COMMUNITY)
Admission: EM | Admit: 2016-02-06 | Discharge: 2016-02-06 | Disposition: A | Discharge status: Home / Self Care | Payer: Self-pay | Attending: Emergency Medicine | Admitting: Emergency Medicine

## 2016-02-06 ENCOUNTER — Encounter (HOSPITAL_COMMUNITY): Payer: Self-pay | Admitting: *Deleted

## 2016-02-06 DIAGNOSIS — Z79899 Other long term (current) drug therapy: Secondary | ICD-10-CM | POA: Insufficient documentation

## 2016-02-06 DIAGNOSIS — Z7952 Long term (current) use of systemic steroids: Secondary | ICD-10-CM | POA: Insufficient documentation

## 2016-02-06 DIAGNOSIS — Z202 Contact with and (suspected) exposure to infections with a predominantly sexual mode of transmission: Secondary | ICD-10-CM | POA: Insufficient documentation

## 2016-02-06 DIAGNOSIS — N342 Other urethritis: Secondary | ICD-10-CM | POA: Insufficient documentation

## 2016-02-06 DIAGNOSIS — Z7982 Long term (current) use of aspirin: Secondary | ICD-10-CM | POA: Insufficient documentation

## 2016-02-06 DIAGNOSIS — I10 Essential (primary) hypertension: Secondary | ICD-10-CM | POA: Insufficient documentation

## 2016-02-06 DIAGNOSIS — Z87891 Personal history of nicotine dependence: Secondary | ICD-10-CM | POA: Insufficient documentation

## 2016-02-06 MED ORDER — STERILE WATER FOR INJECTION IJ SOLN
INTRAMUSCULAR | Status: AC
  Administered 2016-02-06: 10 mL
  Filled 2016-02-06: qty 10

## 2016-02-06 MED ORDER — CEFTRIAXONE SODIUM 250 MG IJ SOLR
250.0000 mg | Freq: Once | INTRAMUSCULAR | Status: AC
  Administered 2016-02-06: 250 mg via INTRAMUSCULAR
  Filled 2016-02-06: qty 250

## 2016-02-06 MED ORDER — AZITHROMYCIN 250 MG PO TABS
1000.0000 mg | ORAL_TABLET | Freq: Once | ORAL | Status: AC
  Administered 2016-02-06: 1000 mg via ORAL
  Filled 2016-02-06: qty 4

## 2016-02-06 NOTE — ED Notes (Signed)
Pt is in stable condition upon d/c and ambulates from ED. 

## 2016-02-06 NOTE — Discharge Instructions (Signed)
Sexually Transmitted Disease °A sexually transmitted disease (STD) is a disease or infection that may be passed (transmitted) from person to person, usually during sexual activity. This may happen by way of saliva, semen, blood, vaginal mucus, or urine. Common STDs include: °· Gonorrhea. °· Chlamydia. °· Syphilis. °· HIV and AIDS. °· Genital herpes. °· Hepatitis B and C. °· Trichomonas. °· Human papillomavirus (HPV). °· Pubic lice. °· Scabies. °· Mites. °· Bacterial vaginosis. °WHAT ARE CAUSES OF STDs? °An STD may be caused by bacteria, a virus, or parasites. STDs are often transmitted during sexual activity if one person is infected. However, they may also be transmitted through nonsexual means. STDs may be transmitted after:  °· Sexual intercourse with an infected person. °· Sharing sex toys with an infected person. °· Sharing needles with an infected person or using unclean piercing or tattoo needles. °· Having intimate contact with the genitals, mouth, or rectal areas of an infected person. °· Exposure to infected fluids during birth. °WHAT ARE THE SIGNS AND SYMPTOMS OF STDs? °Different STDs have different symptoms. Some people may not have any symptoms. If symptoms are present, they may include: °· Painful or bloody urination. °· Pain in the pelvis, abdomen, vagina, anus, throat, or eyes. °· A skin rash, itching, or irritation. °· Growths, ulcerations, blisters, or sores in the genital and anal areas. °· Abnormal vaginal discharge with or without bad odor. °· Penile discharge in men. °· Fever. °· Pain or bleeding during sexual intercourse. °· Swollen glands in the groin area. °· Yellow skin and eyes (jaundice). This is seen with hepatitis. °· Swollen testicles. °· Infertility. °· Sores and blisters in the mouth. °HOW ARE STDs DIAGNOSED? °To make a diagnosis, your health care provider may: °· Take a medical history. °· Perform a physical exam. °· Take a sample of any discharge to examine. °· Swab the throat,  cervix, opening to the penis, rectum, or vagina for testing. °· Test a sample of your first morning urine. °· Perform blood tests. °· Perform a Pap test, if this applies. °· Perform a colposcopy. °· Perform a laparoscopy. °HOW ARE STDs TREATED? °Treatment depends on the STD. Some STDs may be treated but not cured. °· Chlamydia, gonorrhea, trichomonas, and syphilis can be cured with antibiotic medicine. °· Genital herpes, hepatitis, and HIV can be treated, but not cured, with prescribed medicines. The medicines lessen symptoms. °· Genital warts from HPV can be treated with medicine or by freezing, burning (electrocautery), or surgery. Warts may come back. °· HPV cannot be cured with medicine or surgery. However, abnormal areas may be removed from the cervix, vagina, or vulva. °· If your diagnosis is confirmed, your recent sexual partners need treatment. This is true even if they are symptom-free or have a negative culture or evaluation. They should not have sex until their health care providers say it is okay. °· Your health care provider may test you for infection again 3 months after treatment. °HOW CAN I REDUCE MY RISK OF GETTING AN STD? °Take these steps to reduce your risk of getting an STD: °· Use latex condoms, dental dams, and water-soluble lubricants during sexual activity. Do not use petroleum jelly or oils. °· Avoid having multiple sex partners. °· Do not have sex with someone who has other sex partners °· Do not have sex with anyone you do not know or who is at high risk for an STD. °· Avoid risky sex practices that can break your skin. °· Do not have sex   if you have open sores on your mouth or skin.  Avoid drinking too much alcohol or taking illegal drugs. Alcohol and drugs can affect your judgment and put you in a vulnerable position.  Avoid engaging in oral and anal sex acts.  Get vaccinated for HPV and hepatitis. If you have not received these vaccines in the past, talk to your health care  provider about whether one or both might be right for you.  If you are at risk of being infected with HIV, it is recommended that you take a prescription medicine daily to prevent HIV infection. This is called pre-exposure prophylaxis (PrEP). You are considered at risk if:  You are a man who has sex with other men (MSM).  You are a heterosexual man or woman and are sexually active with more than one partner.  You take drugs by injection.  You are sexually active with a partner who has HIV.  Talk with your health care provider about whether you are at high risk of being infected with HIV. If you choose to begin PrEP, you should first be tested for HIV. You should then be tested every 3 months for as long as you are taking PrEP. WHAT SHOULD I DO IF I THINK I HAVE AN STD?  See your health care provider.  Tell your sexual partner(s). They should be tested and treated for any STDs.  Do not have sex until your health care provider says it is okay. WHEN SHOULD I GET IMMEDIATE MEDICAL CARE? Contact your health care provider right away if:   You have severe abdominal pain.  You are a man and notice swelling or pain in your testicles.  You are a woman and notice swelling or pain in your vagina.   This information is not intended to replace advice given to you by your health care provider. Make sure you discuss any questions you have with your health care provider.   Document Released: 11/23/2002 Document Revised: 09/23/2014 Document Reviewed: 03/23/2013 Elsevier Interactive Patient Education 2016 Else[employer] Inc[employer]  Urethritis, Adult Urethritis is an inflammation of the tube through which urine exits your bladder (urethra).  CAUSES Urethritis is often caused by an infection in your urethra. The infection can be viral, like herpes. The infection can also be bacterial, like gonorrhea. RISK FACTORS Risk factors of urethritis include:  Having sex without using a condom.  Having multiple  sexual partners.  Having poor hygiene. SIGNS AND SYMPTOMS Symptoms of urethritis are less noticeable in women than in men. These symptoms include:  Burning feeling when you urinate (dysuria).  Discharge from your urethra.  Blood in your urine (hematuria).  Urinating more than usual. DIAGNOSIS  To confirm a diagnosis of urethritis, your health care provider will do the following:  Ask about your sexual history.  Perform a physical exam.  Have you provide a sample of your urine for lab testing.  Use a cotton swab to gently collect a sample from your urethra for lab testing. TREATMENT  It is important to treat urethritis. Depending on the cause, untreated urethritis may lead to serious genital infections and possibly infertility. Urethritis caused by a bacterial infection is treated with antibiotic medicine. All sexual partners must be treated.  HOME CARE INSTRUCTIONS  Do not have sex until the test results are known and treatment is completed, even if your symptoms go away before you finish treatment.  If you were prescribed an antibiotic, finish it all even if you start to feel better. SEEK  MEDICAL CARE IF:   Your symptoms are not improved in 3 days.  Your symptoms are getting worse.  You develop abdominal pain or pelvic pain (in women).  You develop joint pain.  You have a fever. SEEK IMMEDIATE MEDICAL CARE IF:   You have severe pain in the belly, back, or side.  You have repeated vomiting. MAKE SURE YOU:  Understand these instructions.  Will watch your condition.  Will get help right away if you are not doing well or get worse.   This information is not intended to replace advice given to you by your health care provider. Make sure you discuss any questions you have with your health care provider.   Document Released: 02/26/2001 Document Revised: 01/17/2015 Document Reviewed: 05/03/2013 Elsevier Interactive Patient Education [employer]  Sexually  Transmitted Disease A sexually transmitted disease (STD) is a disease or infection often passed to another person during sex. However, STDs can be passed through nonsexual ways. An STD can be passed through:  Spit (saliva).  Semen.  Blood.  Mucus from the vagina.  Pee (urine). HOW CAN I LESSEN MY CHANCES OF GETTING AN STD?  Use:  Latex condoms.  Water-soluble lubricants with condoms. Do not use petroleum jelly or oils.  Dental dams. These are small pieces of latex that are used as a barrier during oral sex.  Avoid having more than one sex partner.  Do not have sex with someone who has other sex partners.  Do not have sex with anyone you do not know or who is at high risk for an STD.  Avoid risky sex that can break your skin.  Do not have sex if you have open sores on your mouth or skin.  Avoid drinking too much alcohol or taking illegal drugs. Alcohol and drugs can affect your good judgment.  Avoid oral and anal sex acts.  Get shots (vaccines) for HPV and hepatitis.  If you are at risk of being infected with HIV, it is advised that you take a certain medicine daily to prevent HIV infection. This is called pre-exposure prophylaxis (PrEP). You may be at risk if:  You are a man who has sex with other men (MSM).  You are attracted to the opposite sex (heterosexual) and are having sex with more than one partner.  You take drugs with a needle.  You have sex with someone who has HIV.  Talk with your doctor about if you are at high risk of being infected with HIV. If you begin to take PrEP, get tested for HIV first. Get tested every 3 months for as long as you are taking PrEP.  Get tested for STDs every year if you are sexually active. If you are treated for an STD, get tested again 3 months after you are treated. WHAT SHOULD I DO IF I THINK I HAVE AN STD?  See your doctor.  Tell your sex partner(s) that you have an STD. They should be tested and treated.  Do not have  sex until your doctor says it is okay. WHEN SHOULD I GET HELP? Get help right away if: Safe Sex Safe sex is about reducing the risk of giving or getting a sexually transmitted disease (STD). STDs are spread through sexual contact involving the genitals, mouth, or rectum. Some STDs can be cured and others cannot. Safe sex can also prevent unintended pregnancies.  WHAT ARE SOME SAFE SEX PRACTICES?  Limit your sexual activity to only one partner who is having  sex with only you.  Talk to your partner about his or her past partners, past STDs, and drug use.  Use a condom every time you have sexual intercourse. This includes vaginal, oral, and anal sexual activity. Both females and males should wear condoms during oral sex. Only use latex or polyurethane condoms and water-based lubricants. Using petroleum-based lubricants or oils to lubricate a condom will weaken the condom and increase the chance that it will break. The condom should be in place from the beginning to the end of sexual activity. Wearing a condom reduces, but does not completely eliminate, your risk of getting or giving an STD. STDs can be spread by contact with infected body fluids and skin.  Get vaccinated for hepatitis B and HPV.  Avoid alcohol and recreational drugs, which can affect your judgment. You may forget to use a condom or participate in high-risk sex.  For females, avoid douching after sexual intercourse. Douching can spread an infection farther into the reproductive tract.  Check your body for signs of sores, blisters, rashes, or unusual discharge. See your health care provider if you notice any of these signs.  Avoid sexual contact if you have symptoms of an infection or are being treated for an STD. If you or your partner has herpes, avoid sexual contact when blisters are present. Use condoms at all other times.  If you are at risk of being infected with HIV, it is recommended that you take a prescription medicine  daily to prevent HIV infection. This is called pre-exposure prophylaxis (PrEP). You are considered at risk if:  You are a man who has sex with other men (MSM).  You are a heterosexual man or woman who is sexually active with more than one partner.  You take drugs by injection.  You are sexually active with a partner who has HIV.  Talk with your health care provider about whether you are at high risk of being infected with HIV. If you choose to begin PrEP, you should first be tested for HIV. You should then be tested every 3 months for as long as you are taking PrEP.  See your health care provider for regular screenings, exams, and tests for other STDs. Before having sex with a new partner, each of you should be screened for STDs and should talk about the results with each other. WHAT ARE THE BENEFITS OF SAFE SEX?   There is less chance of getting or giving an STD.  You can prevent unwanted or unintended pregnancies.  By discussing safe sex concerns with your partner, you may increase feelings of intimacy, comfort, trust, and honesty between the two of you.   This information is not intended to replace advice given to you by your health care provider. Make sure you discuss any questions you have with your health care provider.   Document Released: 10/10/2004 Document Revised: 09/23/2014 Document Reviewed: 02/24/2012 Elsevier Interactive Patient Education 2016 Elsevier Inc.   You have bad belly (abdominal) pain.  You are a man and have puffiness (swelling) or pain in your testicles.  You are a woman and have puffiness in your vagina.   This information is not intended to replace advice given to you by your health care provider. Make sure you discuss any questions you have with your health care provider.   Document Released: 10/10/2004 Document Revised: 09/23/2014 Document Reviewed: 02/26/2013 Elsevier Interactive Patient Education [employer]

## 2016-02-06 NOTE — ED Notes (Signed)
Pt requests std testing rt drainage from penis with burning with urination.

## 2016-02-06 NOTE — ED Provider Notes (Signed)
CSN: [id number]     Arrival date & time 02/06/16  4010 History  By signing my name below, I, Ronney Lion, attest that this documentation has been prepared under the direction and in the presence of Danelle Berry, PA-C. Electronically Signed: Ronney Lion, ED Scribe. 02/06/2016. 9:29 AM.      Chief Complaint  Patient presents with  . SEXUALLY TRANSMITTED DISEASE   The history is provided by the patient. No language interpreter was used.    HPI Comments: Lance Williams is a 25 y.o. male with a history of hypertension, who presents to the Emergency Department complaining of constant, gradually worsening penile discharge that began several days ago. He states his discharge started off as clear but became "muddier" in color with time. Patient denies dysuria but states he experiences a tingling sensation whenever he urinates. He states he is currently sexually active with one partner, and she was recently tested positive for chlamydia. He denies redness, swelling, lymph node swelling, fever, chills, diaphoresis, scrotal swelling, abdominal pain, nausea, vomiting, back pain, or rash. Patient has NKDA.   Past Medical History  Diagnosis Date  . Hypertension    History reviewed. No pertinent past surgical history. History reviewed. No pertinent family history. Social History  Substance Use Topics  . Smoking status: Former Games developer  . Smokeless tobacco: None  . Alcohol Use: No    Review of Systems  Constitutional: Negative for fever, chills and diaphoresis.  Gastrointestinal: Negative for nausea, vomiting and abdominal pain.  Genitourinary: Positive for discharge. Negative for dysuria, penile swelling and scrotal swelling.  Musculoskeletal: Negative for back pain.  Skin: Negative for rash.  All other systems reviewed and are negative.     Allergies  Review of patient's allergies indicates no known allergies.  Home Medications   Prior to Admission medications   Medication Sig Start Date End  Date Taking? Authorizing Provider  ASPIRIN PO Take 2 tablets by mouth daily as needed (pain).    Historical Provider, MD  hydrocortisone cream 1 % Apply to rash on face TID Patient not taking: Reported on 10/04/2014 05/19/14   Reuben Likes, MD  hydrOXYzine (ATARAX/VISTARIL) 25 MG tablet Take 1 tablet (25 mg total) by mouth every 6 (six) hours as needed for itching. Patient not taking: Reported on 10/04/2014 05/19/14   Reuben Likes, MD  ibuprofen (ADVIL,MOTRIN) 800 MG tablet Take 1 tablet (800 mg total) by mouth every 8 (eight) hours as needed for mild pain or moderate pain. 10/07/14   Trixie Dredge, PA-C  oxyCODONE-acetaminophen (PERCOCET/ROXICET) 5-325 MG per tablet Take 1-2 tablets by mouth every 4 (four) hours as needed for moderate pain or severe pain. 10/07/14   Trixie Dredge, PA-C  predniSONE (STERAPRED UNI-PAK) 10 MG tablet Take by mouth daily. Day 1: take 6 tabs.  Day 2: 5 tabs  Day 3: 4 tabs  Day 4: 3 tabs  Day 5: 2 tabs  Day 6: 1 tab 10/07/14   Trixie Dredge, PA-C  triamcinolone cream (KENALOG) 0.1 % Apply to rash on body TID Patient not taking: Reported on 10/04/2014 05/19/14   Reuben Likes, MD   BP 155/95 mmHg  Pulse 71  Temp(Src) 98.4 F (36.9 C) (Oral)  Resp 16  Ht  (1.727 m)  Wt 88.451 kg  BMI 29.66 kg/m2  SpO2 100% Physical Exam  Constitutional: He is oriented to person, place, and time. He appears well-developed and well-nourished. No distress.  HENT:  Head: Normocephalic and atraumatic.  Eyes: Conjunctivae and  EOM are normal.  Neck: Neck supple. No tracheal deviation present.  Cardiovascular: Normal rate.   Pulmonary/Chest: Effort normal. No respiratory distress.  Abdominal: Hernia confirmed negative in the right inguinal area and confirmed negative in the left inguinal area.  Genitourinary: Testes normal and penis normal. Right testis shows no mass, no swelling and no tenderness. Left testis shows no mass, no swelling and no tenderness. Circumcised. No phimosis, paraphimosis,  hypospadias, penile erythema or penile tenderness. No discharge found.  Musculoskeletal: Normal range of motion.  Lymphadenopathy:       Right: No inguinal adenopathy present.       Left: No inguinal adenopathy present.  Neurological: He is alert and oriented to person, place, and time.  Skin: Skin is warm and dry.  Psychiatric: He has a normal mood and affect. His behavior is normal.  Nursing note and vitals reviewed.   ED Course  Procedures (including critical care time)  DIAGNOSTIC STUDIES: Oxygen Saturation is 100% on RA, normal by my interpretation.    COORDINATION OF CARE: 9:12 AM - Discussed treatment plan with pt at bedside which includes STD testing. Pt verbalized understanding and agreed to plan.   Labs Review Labs Reviewed  GC/CHLAMYDIA PROBE AMP ([place]) NOT AT [facility] - Abnormal; Notable for the following:    Chlamydia **POSITIVE** (*)    All other components within normal limits  RPR  HIV ANTIBODY (ROUTINE TESTING)   MDM   Final diagnoses:  Exposure to STD  Urethritis   Patient treated in the ED for chlamydia exposure and current, new sx of penile discharge with "tingling" with urination, treated with azithromycin and Rocephin. Patient advised to inform and treat all sexual partners.  Pt advised on safe sex practices and understands that they have GC/Chlamydia cultures pending and will result in 2-3 days. HIV and RPR sent. Pt encouraged to follow up at local health department for future STI checks. No concern for prostatitis or epididymitis. Discussed return precautions. Pt appears safe for discharge.   I personally performed the services described in this documentation, which was scribed in my presence. The recorded information has been reviewed and is accurate.       Danelle BerryLeisa Brad Lieurance, PA-C 02/07/16 1152  Arby BarretteMarcy Pfeiffer, MD 02/08/16 [phone]

## 2016-02-07 LAB — RPR: RPR Ser Ql: NONREACTIVE

## 2016-02-07 LAB — GC/CHLAMYDIA PROBE AMP (~~LOC~~) NOT AT ARMC
Chlamydia: POSITIVE — AB
NEISSERIA GONORRHEA: NEGATIVE

## 2016-02-07 LAB — HIV ANTIBODY (ROUTINE TESTING W REFLEX): HIV Screen 4th Generation wRfx: NONREACTIVE

## 2016-02-08 ENCOUNTER — Telehealth (HOSPITAL_BASED_OUTPATIENT_CLINIC_OR_DEPARTMENT_OTHER): Payer: Self-pay | Admitting: Emergency Medicine

## 2016-02-16 ENCOUNTER — Encounter (HOSPITAL_COMMUNITY): Payer: Self-pay | Admitting: Neurology

## 2016-02-16 ENCOUNTER — Emergency Department (HOSPITAL_COMMUNITY)
Admission: EM | Admit: 2016-02-16 | Discharge: 2016-02-16 | Disposition: A | Discharge status: Home / Self Care | Payer: Self-pay | Attending: Emergency Medicine | Admitting: Emergency Medicine

## 2016-02-16 DIAGNOSIS — F17200 Nicotine dependence, unspecified, uncomplicated: Secondary | ICD-10-CM | POA: Insufficient documentation

## 2016-02-16 DIAGNOSIS — I10 Essential (primary) hypertension: Secondary | ICD-10-CM | POA: Insufficient documentation

## 2016-02-16 DIAGNOSIS — N342 Other urethritis: Secondary | ICD-10-CM | POA: Insufficient documentation

## 2016-02-16 LAB — GC/CHLAMYDIA PROBE AMP (~~LOC~~) NOT AT ARMC
CHLAMYDIA, DNA PROBE: NEGATIVE
Neisseria Gonorrhea: NEGATIVE

## 2016-02-16 MED ORDER — DOXYCYCLINE HYCLATE 100 MG PO CAPS: 100.0000 mg | ORAL_CAPSULE | Freq: Two times a day (BID) | ORAL | Status: DC

## 2016-02-16 MED ORDER — CEFTRIAXONE SODIUM 250 MG IJ SOLR
250.0000 mg | Freq: Once | INTRAMUSCULAR | Status: AC
  Administered 2016-02-16: 250 mg via INTRAMUSCULAR
  Filled 2016-02-16: qty 250

## 2016-02-16 MED ORDER — LIDOCAINE HCL (PF) 1 % IJ SOLN
INTRAMUSCULAR | Status: AC
  Administered 2016-02-16: 1 mL via INTRAMUSCULAR
  Filled 2016-02-16: qty 5

## 2016-02-16 MED ORDER — AZITHROMYCIN 250 MG PO TABS
1000.0000 mg | ORAL_TABLET | Freq: Once | ORAL | Status: AC
  Administered 2016-02-16: 1000 mg via ORAL
  Filled 2016-02-16: qty 4

## 2016-02-16 NOTE — ED Provider Notes (Signed)
CSN: [id number]     Arrival date & time 02/16/16  0846 History   First MD Initiated Contact with Patient 02/16/16 [phone]     Chief Complaint  Patient presents with  . Penile Discharge     (Consider location/radiation/quality/duration/timing/severity/associated sxs/prior Treatment) Patient is a 25 y.o. male presenting with penile discharge. The history is provided by the patient. No language interpreter was used.  Penile Discharge This is a recurrent problem. The current episode started in the past 7 days. The problem occurs constantly. The problem has been unchanged. Pertinent negatives include no rash. Nothing aggravates the symptoms. He has tried nothing for the symptoms. The treatment provided no relief.  Pt was seen and diagnosed with Chlamydia.  Pt reports he had a shot and pills here.  Pt denies having sex since.  Pt reports he still has discomfort and penile burning.  Past Medical History  Diagnosis Date  . Hypertension    History reviewed. No pertinent past surgical history. No family history on file. Social History  Substance Use Topics  . Smoking status: Current Every Day Smoker  . Smokeless tobacco: None  . Alcohol Use: No    Review of Systems  Genitourinary: Positive for discharge.  Skin: Negative for rash.  All other systems reviewed and are negative.     Allergies  Review of patient's allergies indicates no known allergies.  Home Medications   Prior to Admission medications   Not on File   BP 143/86 mmHg  Pulse 75  Temp(Src) 98.4 F (36.9 C) (Oral)  Resp 17  SpO2 98% Physical Exam  Constitutional: He is oriented to person, place, and time. He appears well-developed and well-nourished.  HENT:  Head: Normocephalic.  Eyes: EOM are normal.  Neck: Normal range of motion.  Pulmonary/Chest: Effort normal.  Abdominal: He exhibits no distension.  Genitourinary: No penile tenderness.  Clear discharge  Musculoskeletal: Normal range of motion.   Neurological: He is alert and oriented to person, place, and time.  Skin: Skin is warm.  Psychiatric: He has a normal mood and affect.  Nursing note and vitals reviewed.   ED Course  Procedures (including critical care time) Labs Review Labs Reviewed - No data to display  Imaging Review No results found. I have personally reviewed and evaluated these images and lab results as part of my medical decision-making.   EKG Interpretation None      MDM  I will retreat with Rocephin and zithromax.   I obtained swab for gc and ct.   Due to possible resistance I will treat with doxycycline for a week as well.   Final diagnoses:  Urethritis    No current facility-administered medications for this encounter.  Current outpatient prescriptions:  .  doxycycline (VIBRAMYCIN) 100 MG capsule, Take 1 capsule (100 mg total) by mouth 2 (two) times daily., Disp: 20 capsule, Rfl: 0  An After Visit Summary was printed and given to the patient.  Lonia SkinnerLeslie K Mount VisionSofia, PA-C 02/16/16 [phone]  Margarita Grizzleanielle Ray, MD 02/17/16 [phone]

## 2016-02-16 NOTE — Discharge Instructions (Signed)
Urethritis, Adult °Urethritis is an inflammation of the tube through which urine exits your bladder (urethra).  °CAUSES °Urethritis is often caused by an infection in your urethra. The infection can be viral, like herpes. The infection can also be bacterial, like gonorrhea. °RISK FACTORS °Risk factors of urethritis include: °· Having sex without using a condom. °· Having multiple sexual partners. °· Having poor hygiene. °SIGNS AND SYMPTOMS °Symptoms of urethritis are less noticeable in women than in men. These symptoms include: °· Burning feeling when you urinate (dysuria). °· Discharge from your urethra. °· Blood in your urine (hematuria). °· Urinating more than usual. °DIAGNOSIS  °To confirm a diagnosis of urethritis, your health care provider will do the following: °· Ask about your sexual history. °· Perform a physical exam. °· Have you provide a sample of your urine for lab testing. °· Use a cotton swab to gently collect a sample from your urethra for lab testing. °TREATMENT  °It is important to treat urethritis. Depending on the cause, untreated urethritis may lead to serious genital infections and possibly infertility. Urethritis caused by a bacterial infection is treated with antibiotic medicine. All sexual partners must be treated.  °HOME CARE INSTRUCTIONS °· Do not have sex until the test results are known and treatment is completed, even if your symptoms go away before you finish treatment. °· If you were prescribed an antibiotic, finish it all even if you start to feel better. °SEEK MEDICAL CARE IF:  °· Your symptoms are not improved in 3 days. °· Your symptoms are getting worse. °· You develop abdominal pain or pelvic pain (in women). °· You develop joint pain. °· You have a fever. °SEEK IMMEDIATE MEDICAL CARE IF:  °· You have severe pain in the belly, back, or side. °· You have repeated vomiting. °MAKE SURE YOU: °· Understand these instructions. °· Will watch your condition. °· Will get help right away  if you are not doing well or get worse. °  °This information is not intended to replace advice given to you by your health care provider. Make sure you discuss any questions you have with your health care provider. °  °Document Released: 02/26/2001 Document Revised: 01/17/2015 Document Reviewed: 05/03/2013 °Elsevier Interactive Patient Education ©2016 [employer] ° °

## 2016-02-16 NOTE — ED Notes (Signed)
Pt reports exposure to chlamydia and was treated last week. Is still have penile discharge, is clear but sometimes green. Denies dysuria.

## 2016-03-25 ENCOUNTER — Encounter: Payer: Self-pay | Admitting: Emergency Medicine

## 2016-03-25 ENCOUNTER — Emergency Department: Payer: No Typology Code available for payment source

## 2016-03-25 ENCOUNTER — Emergency Department
Admission: EM | Admit: 2016-03-25 | Discharge: 2016-03-25 | Disposition: A | Discharge status: Home / Self Care | Payer: No Typology Code available for payment source | Attending: Emergency Medicine | Admitting: Emergency Medicine

## 2016-03-25 ENCOUNTER — Emergency Department: Payer: Self-pay

## 2016-03-25 DIAGNOSIS — R51 Headache: Secondary | ICD-10-CM | POA: Insufficient documentation

## 2016-03-25 DIAGNOSIS — Y999 Unspecified external cause status: Secondary | ICD-10-CM | POA: Insufficient documentation

## 2016-03-25 DIAGNOSIS — I10 Essential (primary) hypertension: Secondary | ICD-10-CM | POA: Insufficient documentation

## 2016-03-25 DIAGNOSIS — Y92411 Interstate highway as the place of occurrence of the external cause: Secondary | ICD-10-CM | POA: Insufficient documentation

## 2016-03-25 DIAGNOSIS — F17200 Nicotine dependence, unspecified, uncomplicated: Secondary | ICD-10-CM | POA: Insufficient documentation

## 2016-03-25 DIAGNOSIS — S39012A Strain of muscle, fascia and tendon of lower back, initial encounter: Secondary | ICD-10-CM | POA: Insufficient documentation

## 2016-03-25 DIAGNOSIS — S161XXA Strain of muscle, fascia and tendon at neck level, initial encounter: Secondary | ICD-10-CM | POA: Insufficient documentation

## 2016-03-25 DIAGNOSIS — Y9389 Activity, other specified: Secondary | ICD-10-CM | POA: Insufficient documentation

## 2016-03-25 MED ORDER — CYCLOBENZAPRINE HCL 10 MG PO TABS: 10.0000 mg | ORAL_TABLET | Freq: Three times a day (TID) | ORAL | Status: DC | PRN

## 2016-03-25 MED ORDER — IBUPROFEN 800 MG PO TABS: 800.0000 mg | ORAL_TABLET | Freq: Three times a day (TID) | ORAL | Status: DC | PRN

## 2016-03-25 NOTE — ED Notes (Signed)
Discharge instructions reviewed with patient. Questions fielded by this RN. Patient verbalizes understanding of instructions. Patient discharged home in stable condition per Beers PA. No acute distress noted at time of discharge.   
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
## 2016-03-30 ENCOUNTER — Telehealth: Payer: Self-pay | Admitting: *Deleted

## 2016-03-30 NOTE — Telephone Encounter (Signed)
No response to phone or letter regarding (+)STD, treatment received at ER visit

## 2016-06-18 ENCOUNTER — Emergency Department (HOSPITAL_COMMUNITY)
Admission: EM | Admit: 2016-06-18 | Discharge: 2016-06-18 | Disposition: A | Discharge status: Home / Self Care | Payer: Self-pay | Attending: Emergency Medicine | Admitting: Emergency Medicine

## 2016-06-18 ENCOUNTER — Encounter (HOSPITAL_COMMUNITY): Payer: Self-pay | Admitting: Emergency Medicine

## 2016-06-18 DIAGNOSIS — F17200 Nicotine dependence, unspecified, uncomplicated: Secondary | ICD-10-CM | POA: Insufficient documentation

## 2016-06-18 DIAGNOSIS — Z202 Contact with and (suspected) exposure to infections with a predominantly sexual mode of transmission: Secondary | ICD-10-CM | POA: Insufficient documentation

## 2016-06-18 DIAGNOSIS — Z711 Person with feared health complaint in whom no diagnosis is made: Secondary | ICD-10-CM

## 2016-06-18 DIAGNOSIS — I10 Essential (primary) hypertension: Secondary | ICD-10-CM | POA: Insufficient documentation

## 2016-06-18 DIAGNOSIS — J029 Acute pharyngitis, unspecified: Secondary | ICD-10-CM | POA: Insufficient documentation

## 2016-06-18 LAB — RAPID STREP SCREEN (MED CTR MEBANE ONLY): Streptococcus, Group A Screen (Direct): NEGATIVE

## 2016-06-18 MED ORDER — PENICILLIN V POTASSIUM 500 MG PO TABS: 500.0000 mg | ORAL_TABLET | Freq: Four times a day (QID) | ORAL | 0 refills | Status: AC

## 2016-06-18 NOTE — ED Triage Notes (Signed)
Pt states his 'partner' is having sx of STD. ALSO,, c/o sore throat x 3-4 days. No exudate noted.

## 2016-06-18 NOTE — ED Notes (Signed)
[facility] 60 COUPON GIVEN

## 2016-06-18 NOTE — ED Provider Notes (Signed)
[facility]-EMERGENCY DEPT Provider Note   CSN: [id number] Arrival date & time: 06/18/16  1059     History   Chief Complaint Chief Complaint  Patient presents with  . Exposure to STD  . Sore Throat    HPI Lance C Renette ButtersGolden is a 25 y.o. male.  The history is provided by the patient. No language interpreter was used.  Exposure to STD  This is a new problem. Pertinent negatives include no abdominal pain. Nothing aggravates the symptoms. Nothing relieves the symptoms. He has tried nothing for the symptoms. The treatment provided no relief.  Sore Throat  This is a new problem. The current episode started more than 2 days ago. The problem occurs constantly. The problem has been gradually worsening. Pertinent negatives include no abdominal pain. Nothing aggravates the symptoms. Nothing relieves the symptoms. He has tried nothing for the symptoms. The treatment provided no relief.  Pt complains of pain in his throat.  Pt also wants to get checked for Std.  (Pt checked here 3 times this year)   Past Medical History:  Diagnosis Date  . Hypertension     Patient Active Problem List   Diagnosis Date Noted  . ELEVATED BLOOD PRESSURE 08/02/2009  . BEE STING 06/28/2008  . 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 No pertinent surgical history.     Home Medications    Prior to Admission medications   Medication Sig Start Date End Date Taking? Authorizing Provider  cyclobenzaprine (FLEXERIL) 10 MG tablet Take 1 tablet (10 mg total) by mouth 3 (three) times daily as needed for muscle spasms. 03/25/16   Charmayne Sheerharles M Beers, PA-C  ibuprofen (ADVIL,MOTRIN) 800 MG tablet Take 1 tablet (800 mg total) by mouth every 8 (eight) hours as needed. 03/25/16   Charmayne Sheerharles M Beers, PA-C  penicillin v potassium (VEETID) 500 MG tablet Take 1 tablet (500 mg total) by mouth 4 (four) times daily. 06/18/16 06/25/16  Elson AreasLeslie K Sofia, PA-C    Family History No family history on file.  Social History Social History    Substance Use Topics  . Smoking status: Current Every Day Smoker  . Smokeless tobacco: Never Used  . Alcohol use No     Allergies   Review of patient's allergies indicates no known allergies.   Review of Systems Review of Systems  Gastrointestinal: Negative for abdominal pain.  All other systems reviewed and are negative.    Physical Exam Updated Vital Signs BP 142/81 (BP Location: Left Arm)   Pulse 68   Temp 98.3 F (36.8 C) (Oral)   Resp 18   Ht 5\' 8"  (1.727 m)   Wt 88.5 kg   SpO2 100%   BMI 29.65 kg/m   Physical Exam  Constitutional: He appears well-developed and well-nourished.  HENT:  Head: Normocephalic and atraumatic.  Eyes: Conjunctivae are normal.  Neck: Neck supple.  Cardiovascular: Normal rate and regular rhythm.   No murmur heard. Pulmonary/Chest: Effort normal and breath sounds normal. No respiratory distress.  Abdominal: Soft. There is no tenderness.  Genitourinary: Penis normal.  Musculoskeletal: He exhibits no edema.  Neurological: He is alert.  Skin: Skin is warm and dry.  Psychiatric: He has a normal mood and affect.  Nursing note and vitals reviewed.    ED Treatments / Results  Labs (all labs ordered are listed, but only abnormal results are displayed) Labs Reviewed  RAPID STREP SCREEN (NOT AT [facility])  CULTURE, GROUP A STREP ([facility])  RPR  HIV ANTIBODY (ROUTINE TESTING)  GC/CHLAMYDIA PROBE AMP ([place]) NOT AT [facility]    EKG  EKG Interpretation None       Radiology No results found.  Procedures Procedures (including critical care time)  Medications Ordered in ED Medications - No data to display   Initial Impression / Assessment and Plan / ED Course  I have reviewed the triage vital signs and the nursing notes.  Pertinent labs & imaging results that were available during my care of the patient were reviewed by me and considered in my medical decision making (see chart for details).  Clinical Course    Rpr, hiv, gc  and ct.    Final Clinical Impressions(s) / ED Diagnoses   Final diagnoses:  Pharyngitis, unspecified etiology  Concern about STD in male without diagnosis    New Prescriptions New Prescriptions   PENICILLIN V POTASSIUM (VEETID) 500 MG TABLET    Take 1 tablet (500 mg total) by mouth 4 (four) times daily.  An After Visit Summary was printed and given to the patient.   Lonia Skinner [place], PA-C 06/18/16 1223    Lyndal Pulley, MD 06/20/16 [phone]

## 2016-06-19 LAB — HIV ANTIBODY (ROUTINE TESTING W REFLEX): HIV Screen 4th Generation wRfx: NONREACTIVE

## 2016-06-19 LAB — RPR: RPR Ser Ql: NONREACTIVE

## 2016-06-19 LAB — GC/CHLAMYDIA PROBE AMP (~~LOC~~) NOT AT ARMC
Chlamydia: NEGATIVE
Neisseria Gonorrhea: NEGATIVE

## 2016-06-20 LAB — CULTURE, GROUP A STREP (THRC)

## 2017-08-20 ENCOUNTER — Emergency Department (HOSPITAL_COMMUNITY)
Admission: EM | Admit: 2017-08-20 | Discharge: 2017-08-20 | Disposition: A | Discharge status: Home / Self Care | Payer: Self-pay | Attending: Emergency Medicine | Admitting: Emergency Medicine

## 2017-08-20 ENCOUNTER — Encounter (HOSPITAL_COMMUNITY): Payer: Self-pay | Admitting: Emergency Medicine

## 2017-08-20 ENCOUNTER — Other Ambulatory Visit: Payer: Self-pay

## 2017-08-20 DIAGNOSIS — F17200 Nicotine dependence, unspecified, uncomplicated: Secondary | ICD-10-CM | POA: Insufficient documentation

## 2017-08-20 DIAGNOSIS — I10 Essential (primary) hypertension: Secondary | ICD-10-CM | POA: Insufficient documentation

## 2017-08-20 DIAGNOSIS — Z711 Person with feared health complaint in whom no diagnosis is made: Secondary | ICD-10-CM

## 2017-08-20 DIAGNOSIS — Z79899 Other long term (current) drug therapy: Secondary | ICD-10-CM | POA: Insufficient documentation

## 2017-08-20 DIAGNOSIS — R369 Urethral discharge, unspecified: Secondary | ICD-10-CM

## 2017-08-20 DIAGNOSIS — R300 Dysuria: Secondary | ICD-10-CM | POA: Insufficient documentation

## 2017-08-20 LAB — URINALYSIS, ROUTINE W REFLEX MICROSCOPIC
BILIRUBIN URINE: NEGATIVE
GLUCOSE, UA: NEGATIVE mg/dL
Hgb urine dipstick: NEGATIVE
KETONES UR: NEGATIVE mg/dL
Leukocytes, UA: NEGATIVE
Nitrite: NEGATIVE
PH: 6 (ref 5.0–8.0)
Protein, ur: NEGATIVE mg/dL
SPECIFIC GRAVITY, URINE: 1.029 (ref 1.005–1.030)

## 2017-08-20 MED ORDER — AZITHROMYCIN 250 MG PO TABS
1000.0000 mg | ORAL_TABLET | Freq: Once | ORAL | Status: AC
  Administered 2017-08-20: 1000 mg via ORAL
  Filled 2017-08-20: qty 4

## 2017-08-20 MED ORDER — LIDOCAINE HCL (PF) 1 % IJ SOLN
INTRAMUSCULAR | Status: AC
  Administered 2017-08-20: 5 mL
  Filled 2017-08-20: qty 5

## 2017-08-20 MED ORDER — CEFTRIAXONE SODIUM 250 MG IJ SOLR
250.0000 mg | Freq: Once | INTRAMUSCULAR | Status: AC
  Administered 2017-08-20: 250 mg via INTRAMUSCULAR
  Filled 2017-08-20: qty 250

## 2017-08-20 NOTE — ED Provider Notes (Signed)
[facility]   CSN: [id number] Arrival date & time: 08/20/17  [id number]     History   Chief Complaint Chief Complaint  Patient presents with  . Penile Discharge    HPI Lance Williams is a 26 y.o. male with past medical history of hypertension, who presents to ED for evaluation of penile discharge for the past 2 days.  He did have unprotected sexual intercourse about 5 days ago.  He reports cloudy, yellowish discharge and some burning with urination associated with it.  He reports history of similar symptoms in the past but was unknown about his diagnosis.  He denies any abdominal pain, fever, nausea, vomiting, bowel changes, rashes or lesions.  HPI  Past Medical History:  Diagnosis Date  . Hypertension     Patient Active Problem List   Diagnosis Date Noted  . ELEVATED BLOOD PRESSURE 08/02/2009  . BEE STING 06/28/2008  . 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 No pertinent surgical history.     Home Medications    Prior to Admission medications   Medication Sig Start Date End Date Taking? Authorizing Provider  cyclobenzaprine (FLEXERIL) 10 MG tablet Take 1 tablet (10 mg total) by mouth 3 (three) times daily as needed for muscle spasms. 03/25/16   Beers, Charmayne Sheerharles M, PA-C  ibuprofen (ADVIL,MOTRIN) 800 MG tablet Take 1 tablet (800 mg total) by mouth every 8 (eight) hours as needed. 03/25/16   Beers, Charmayne Sheerharles M, PA-C    Family History History reviewed. No pertinent family history.  Social History Social History   Tobacco Use  . Smoking status: Current Every Day Smoker  . Smokeless tobacco: Never Used  Substance Use Topics  . Alcohol use: No  . Drug use: No     Allergies   Patient has no known allergies.   Review of Systems Review of Systems  Constitutional: Negative for chills and fever.  Gastrointestinal: Negative for abdominal pain, constipation, diarrhea, nausea and vomiting.  Genitourinary: Positive  for discharge and dysuria. Negative for flank pain, genital sores, hematuria, penile pain, penile swelling, scrotal swelling, testicular pain and urgency.  Skin: Negative for rash.     Physical Exam Updated Vital Signs BP (!) 154/100 (BP Location: Right Arm)   Pulse 70   Temp 98.3 F (36.8 C) (Oral)   Resp 17   SpO2 100%   Physical Exam  Constitutional: He appears well-developed and well-nourished. No distress.  HENT:  Head: Normocephalic and atraumatic.  Eyes: Conjunctivae and EOM are normal. No scleral icterus.  Neck: Normal range of motion.  Pulmonary/Chest: Effort normal. No respiratory distress.  Genitourinary: Testes normal. Right testis shows no tenderness. Left testis shows no tenderness. Circumcised.  Genitourinary Comments: Normal male genitalia noted. Penis, scrotum, and testicles without swelling, lesions, rashes, or tenderness present.  Scant yellow penile discharge noted. Cremasteric reflex intact. NT served as chaperone during the exam.   Neurological: He is alert.  Skin: No rash noted. He is not diaphoretic.  Psychiatric: He has a normal mood and affect.  Nursing note and vitals reviewed.    ED Treatments / Results  Labs (all labs ordered are listed, but only abnormal results are displayed) Labs Reviewed  URINALYSIS, ROUTINE W REFLEX MICROSCOPIC  HIV ANTIBODY (ROUTINE TESTING)  RPR  GC/CHLAMYDIA PROBE AMP ([place]) NOT AT [facility]    EKG  EKG Interpretation None       Radiology No results found.  Procedures Procedures (including critical care time)  Medications Ordered in ED Medications  cefTRIAXone (ROCEPHIN) injection 250 mg (not administered)  azithromycin (ZITHROMAX) tablet 1,000 mg (not administered)  lidocaine (PF) (XYLOCAINE) 1 % injection (not administered)     Initial Impression / Assessment and Plan / ED Course  I have reviewed the triage vital signs and the nursing notes.  Pertinent labs & imaging results that were available  during my care of the patient were reviewed by me and considered in my medical decision making (see chart for details).     Patient presents to ED for evaluation of penile discharge for the past 2 days.  Does report unprotected sexual intercourse 5 days ago.  There is yellowish penile discharge noted.  Also reports dysuria.  No rashes or lesions and no abdominal pain, fevers, or bowel changes.  Urinalysis with no evidence of UTI.  Will treat empirically for gonorrhea and chlamydia pending GC, chlamydia, HIV and RPR.  Encourage patient to follow-up wellness center for further evaluation.  Patient appears stable for discharge at this time.  Strict return precautions given.  Final Clinical Impressions(s) / ED Diagnoses   Final diagnoses:  Concern about STD in male without diagnosis  Penile discharge    ED Discharge Orders    None       Dietrich PatesKhatri, Phillipe Clemon, PA-C 08/20/17 1143    Melene PlanFloyd, Dan, DO 08/20/17 1343

## 2017-08-20 NOTE — ED Notes (Signed)
Pt denies n/v, abd pain, fever, dizziness. Only complaint is penile discharge

## 2017-08-20 NOTE — Discharge Instructions (Addendum)
Your urinalysis showed no signs of UTI. We will contact you regarding results of your remaining lab work when available. Return to ED for worsening symptoms, abdominal pain and fevers, rashes or lesions in your genital area.

## 2017-08-20 NOTE — ED Triage Notes (Signed)
Pt to ER for evaluation of penile discharge onset yesterday. States here for STD check

## 2017-08-21 LAB — RPR: RPR: NONREACTIVE

## 2017-08-21 LAB — GC/CHLAMYDIA PROBE AMP (~~LOC~~) NOT AT ARMC
Chlamydia: POSITIVE — AB
NEISSERIA GONORRHEA: POSITIVE — AB

## 2017-08-21 LAB — HIV ANTIBODY (ROUTINE TESTING W REFLEX): HIV Screen 4th Generation wRfx: NONREACTIVE

## 2017-12-12 ENCOUNTER — Other Ambulatory Visit: Payer: Self-pay

## 2017-12-12 ENCOUNTER — Emergency Department (HOSPITAL_COMMUNITY)
Admission: EM | Admit: 2017-12-12 | Discharge: 2017-12-12 | Disposition: A | Discharge status: Home / Self Care | Payer: Self-pay | Attending: Emergency Medicine | Admitting: Emergency Medicine

## 2017-12-12 DIAGNOSIS — I10 Essential (primary) hypertension: Secondary | ICD-10-CM | POA: Insufficient documentation

## 2017-12-12 DIAGNOSIS — F17210 Nicotine dependence, cigarettes, uncomplicated: Secondary | ICD-10-CM | POA: Insufficient documentation

## 2017-12-12 DIAGNOSIS — H6123 Impacted cerumen, bilateral: Secondary | ICD-10-CM | POA: Insufficient documentation

## 2017-12-12 DIAGNOSIS — H9202 Otalgia, left ear: Secondary | ICD-10-CM | POA: Insufficient documentation

## 2017-12-12 MED ORDER — CARBAMIDE PEROXIDE 6.5 % OT SOLN: 5.0000 [drp] | Freq: Two times a day (BID) | OTIC | 0 refills | Status: DC

## 2017-12-12 NOTE — ED Triage Notes (Signed)
Per pt: Pt reports left ear pain similar to when he has had swimmers ear in the past. Pt reports no sore throat or difficulty swallowing.

## 2017-12-12 NOTE — Discharge Instructions (Signed)
Use Debrox once a month for 3 days to soften ceruman and prevent future impactions. NEVER use a Q tip in the ear canal

## 2017-12-12 NOTE — ED Provider Notes (Signed)
[facility]-EMERGENCY DEPT Provider Note   CSN: [id number] Arrival date & time: 12/12/17  2033     History   Chief Complaint Chief Complaint  Patient presents with  . Otalgia    HPI  Blood pressure (!) 148/94, pulse 82, temperature 98.6 F (37 C), temperature source Oral, resp. rate 15, SpO2 99 %.  Lance Williams is a 27 y.o. male complaining of pain and discomfort to left ear with associated reduced hearing worsening over the course of the last several weeks very.  He denies any discharge.  No pain medication taken prior to arrival, no fever, chills, rhinorrhea.  Past Medical History:  Diagnosis Date  . Hypertension     Patient Active Problem List   Diagnosis Date Noted  . ELEVATED BLOOD PRESSURE 08/02/2009  . BEE STING 06/28/2008  . ACNE, MILD 03/18/2007    No past surgical history on file.      Home Medications    Prior to Admission medications   Medication Sig Start Date End Date Taking? Authorizing Provider  carbamide peroxide (DEBROX) 6.5 % OTIC solution Place 5 drops into both ears 2 (two) times daily. 12/12/17   Giovannie Scerbo, Joni ReiningNicole, PA-C  cyclobenzaprine (FLEXERIL) 10 MG tablet Take 1 tablet (10 mg total) by mouth 3 (three) times daily as needed for muscle spasms. 03/25/16   Beers, Charmayne Sheerharles M, PA-C  ibuprofen (ADVIL,MOTRIN) 800 MG tablet Take 1 tablet (800 mg total) by mouth every 8 (eight) hours as needed. 03/25/16   Evangeline DakinBeers, Charles M, PA-C    Family History No family history on file.  Social History Social History   Tobacco Use  . Smoking status: Current Every Day Smoker  . Smokeless tobacco: Never Used  Substance Use Topics  . Alcohol use: No  . Drug use: No     Allergies   Patient has no known allergies.   Review of Systems Review of Systems  A complete review of systems was obtained and all systems are negative except as noted in the HPI and PMH.   Physical Exam Updated Vital Signs BP (!) 148/94 (BP Location: Right  Arm)   Pulse 82   Temp 98.6 F (37 C) (Oral)   Resp 15   SpO2 99%   Physical Exam  Constitutional: He is oriented to person, place, and time. He appears well-developed and well-nourished. No distress.  HENT:  Head: Normocephalic and atraumatic.  Mouth/Throat: Oropharynx is clear and moist.  Bilateral cerumen impaction, outer ear canal is not irritated, no discharge, no tragal tenderness bilaterally  Eyes: Pupils are equal, round, and reactive to light. Conjunctivae and EOM are normal.  Neck: Normal range of motion.  Cardiovascular: Normal rate, regular rhythm and intact distal pulses.  Pulmonary/Chest: Effort normal and breath sounds normal.  Abdominal: Soft. There is no tenderness.  Musculoskeletal: Normal range of motion.  Neurological: He is alert and oriented to person, place, and time.  Skin: He is not diaphoretic.  Psychiatric: He has a normal mood and affect.  Nursing note and vitals reviewed.    ED Treatments / Results  Labs (all labs ordered are listed, but only abnormal results are displayed) Labs Reviewed - No data to display  EKG None  Radiology No results found.  Procedures Procedures (including critical care time)  Medications Ordered in ED Medications - No data to display   Initial Impression / Assessment and Plan / ED Course  I have reviewed the triage vital signs and the nursing notes.  Pertinent  labs & imaging results that were available during my care of the patient were reviewed by me and considered in my medical decision making (see chart for details).    Vitals:   12/12/17 2046  BP: (!) 148/94  Pulse: 82  Resp: 15  Temp: 98.6 F (37 C)  TempSrc: Oral  SpO2: 99%    Lance Williams is 27 y.o. male presenting with pain and decreased hearing left ear, he has a cerumen impaction bilaterally, I have removed quite a bit of the cerumen but quite a bit still remains.  Patient will be given Debrox, referral to urgent care for possible  irrigation.  Evaluation does not show pathology that would require ongoing emergent intervention or inpatient treatment. Pt is hemodynamically stable and mentating appropriately. Discussed findings and plan with patient/guardian, who agrees with care plan. All questions answered. Return precautions discussed and outpatient follow up given.      Final Clinical Impressions(s) / ED Diagnoses   Final diagnoses:  Bilateral impacted cerumen    ED Discharge Orders        Ordered    carbamide peroxide (DEBROX) 6.5 % OTIC solution  2 times daily     12/12/17 2210       Shawntez Dickison, Mardella Layman 12/12/17 2215    Linwood Dibbles, MD 12/12/17 2340

## 2017-12-23 ENCOUNTER — Encounter (HOSPITAL_COMMUNITY): Payer: Self-pay

## 2017-12-23 ENCOUNTER — Emergency Department (HOSPITAL_COMMUNITY)
Admission: EM | Admit: 2017-12-23 | Discharge: 2017-12-23 | Disposition: A | Discharge status: Home / Self Care | Payer: Self-pay | Attending: Emergency Medicine | Admitting: Emergency Medicine

## 2017-12-23 ENCOUNTER — Other Ambulatory Visit: Payer: Self-pay

## 2017-12-23 DIAGNOSIS — Z202 Contact with and (suspected) exposure to infections with a predominantly sexual mode of transmission: Secondary | ICD-10-CM | POA: Insufficient documentation

## 2017-12-23 DIAGNOSIS — R369 Urethral discharge, unspecified: Secondary | ICD-10-CM | POA: Insufficient documentation

## 2017-12-23 DIAGNOSIS — F17200 Nicotine dependence, unspecified, uncomplicated: Secondary | ICD-10-CM | POA: Insufficient documentation

## 2017-12-23 DIAGNOSIS — H6123 Impacted cerumen, bilateral: Secondary | ICD-10-CM | POA: Insufficient documentation

## 2017-12-23 DIAGNOSIS — I10 Essential (primary) hypertension: Secondary | ICD-10-CM | POA: Insufficient documentation

## 2017-12-23 DIAGNOSIS — Z711 Person with feared health complaint in whom no diagnosis is made: Secondary | ICD-10-CM

## 2017-12-23 LAB — URINALYSIS, ROUTINE W REFLEX MICROSCOPIC
Bilirubin Urine: NEGATIVE
GLUCOSE, UA: NEGATIVE mg/dL
HGB URINE DIPSTICK: NEGATIVE
KETONES UR: NEGATIVE mg/dL
NITRITE: NEGATIVE
PH: 5 (ref 5.0–8.0)
Protein, ur: NEGATIVE mg/dL
Specific Gravity, Urine: 1.028 (ref 1.005–1.030)

## 2017-12-23 MED ORDER — AZITHROMYCIN 250 MG PO TABS
1000.0000 mg | ORAL_TABLET | Freq: Once | ORAL | Status: AC
Start: 2017-12-23 — End: 2017-12-23
  Administered 2017-12-23: 1000 mg via ORAL
  Filled 2017-12-23: qty 4

## 2017-12-23 MED ORDER — STERILE WATER FOR INJECTION IJ SOLN
INTRAMUSCULAR | Status: AC
  Administered 2017-12-23: 10 mL
  Filled 2017-12-23: qty 10

## 2017-12-23 MED ORDER — CEFTRIAXONE SODIUM 250 MG IJ SOLR
250.0000 mg | Freq: Once | INTRAMUSCULAR | Status: AC
  Administered 2017-12-23: 250 mg via INTRAMUSCULAR
  Filled 2017-12-23: qty 250

## 2017-12-23 NOTE — ED Triage Notes (Addendum)
Pt reports that his significant other has recently been exposed to a STD Pt is not sure which one and would like to be checked pt reports having unprotected Sex. Pt denies pain and burning with urination. Pt does report a cloudy white discharge. Pt wants a HIV test as well.   Pt reports that he is having rt ear pain. Pt reports that he was recently seen for ear infection to left ear. Pt feels like his hearing is impaired.

## 2017-12-23 NOTE — ED Provider Notes (Signed)
[facility]-EMERGENC[facility] DEPT Provider Note   CSN: [id number] Arrival date & time: 12/23/17  1751     History   Chief Complaint Chief Complaint  Patient presents with  . Exposure to STD  . Otalgia    HPI Lance C Renette ButtersGolden is a 27 y.o. male who presents today for evaluation of 2 complaints. 1.  Penile drainage.  He reports that his girlfriend recently informed him that she was treated for an STD, he does not know which.  He reports that they regularly have unprotected intercourse.  He reports that for the past 3 days he has been having white penile discharge.  He reports that he was diagnosed with an STD many years ago however does not remember which.  He denies any fevers or chills, no rashes.  He wishes to be tested for "Everything".  2.  Ear pain: He reports that he was treated for an ear infection in his left ear 10 days ago and reports that his pain is now on both of his ears and did not seem to improve.  Chart review shows that he was noted to have bilateral cerumen impaction with a large amount of wax removed then and was told to start using Debrox drops.  He reports that he did not start using Debrox drops.  No fevers or chills.  He denies using Q-tips to clean his ears.  HPI  Past Medical History:  Diagnosis Date  . Hypertension     Patient Active Problem List   Diagnosis Date Noted  . ELEVATED BLOOD PRESSURE 08/02/2009  . BEE STING 06/28/2008  . 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 No pertinent surgical history.      Home Medications    Prior to Admission medications   Medication Sig Start Date End Date Taking? Authorizing Provider  carbamide peroxide (DEBROX) 6.5 % OTIC solution Place 5 drops into both ears 2 (two) times daily. 12/12/17   Pisciotta, Joni ReiningNicole, PA-C  cyclobenzaprine (FLEXERIL) 10 MG tablet Take 1 tablet (10 mg total) by mouth 3 (three) times daily as needed for muscle spasms. 03/25/16   Beers, Charmayne Sheerharles M, PA-C  ibuprofen  (ADVIL,MOTRIN) 800 MG tablet Take 1 tablet (800 mg total) by mouth every 8 (eight) hours as needed. 03/25/16   Evangeline DakinBeers, Charles M, PA-C    Family History No family history on file.  Social History Social History   Tobacco Use  . Smoking status: Current Every Day Smoker  . Smokeless tobacco: Never Used  Substance Use Topics  . Alcohol use: No  . Drug use: No     Allergies   Patient has no known allergies.   Review of Systems Review of Systems  Constitutional: Negative for chills and fever.  HENT: Positive for ear pain. Negative for congestion.   Genitourinary: Positive for discharge. Negative for dysuria, frequency, penile pain, penile swelling, scrotal swelling and urgency.  Skin: Negative for rash.  Neurological: Negative for headaches.  All other systems reviewed and are negative.    Physical Exam Updated Vital Signs BP (!) 147/102   Pulse 69   Temp 99.2 F (37.3 C) (Oral)   Resp 16   Ht 5\' 9"  (1.753 m)   Wt 88.5 kg (195 lb)   SpO2 100%   BMI 28.80 kg/m   Physical Exam  Constitutional: He is oriented to person, place, and time. He appears well-developed and well-nourished.  HENT:  Head: Normocephalic and atraumatic.  Mouth/Throat: Oropharynx is clear and moist.  Lateral cerumen impaction, unable to see bilateral TMs.  No pain with tragus palpation or generalized ear movement.  Eyes: Conjunctivae are normal.  Neck: Normal range of motion. Neck supple.  Genitourinary:  Genitourinary Comments: Patient refused exam  Neurological: He is alert and oriented to person, place, and time.  Skin: Skin is warm and dry. He is not diaphoretic.  Nursing note and vitals reviewed.    ED Treatments / Results  Labs (all labs ordered are listed, but only abnormal results are displayed) Labs Reviewed  RPR  HIV ANTIBODY (ROUTINE TESTING)  URINALYSIS, ROUTINE W REFLEX MICROSCOPIC  GC/CHLAMYDIA PROBE AMP ([place]) NOT AT [facility]    EKG None  Radiology No results  found.  Procedures .Ear Cerumen Removal Date/Time: 12/23/2017 10:24 PM Performed by: Cristina Gong, PA-C Authorized by: Cristina Gong, PA-C   Consent:    Consent obtained:  Verbal   Consent given by:  Patient   Risks discussed:  Bleeding, dizziness, infection, incomplete removal, pain and TM perforation   Alternatives discussed:  No treatment, alternative treatment and referral Procedure details:    Location:  L ear and R ear   Procedure type: curette   Post-procedure details:    Post-procedure ear inspection: No obvious bleeding or macerated skin.  Still unable to view TMs.   Post-procedure hearing quality: Unchanged.   Patient tolerance of procedure:  Tolerated well, no immediate complications Comments:     I was able to remove a large amount of cerumen from his bilateral ears, however hard, impacted cerumen still remains.   (including critical care time)   Medications Ordered in ED Medications  cefTRIAXone (ROCEPHIN) injection 250 mg (250 mg Intramuscular Given 12/23/17 2142)  azithromycin (ZITHROMAX) tablet 1,000 mg (1,000 mg Oral Given 12/23/17 2142)  sterile water (preservative free) injection (10 mLs  Given 12/23/17 2142)     Initial Impression / Assessment and Plan / ED Course  I have reviewed the triage vital signs and the nursing notes.  Pertinent labs & imaging results that were available during my care of the patient were reviewed by me and considered in my medical decision making (see chart for details).     STD cultures obtained including HIV, syphilis, gonorrhea and chlamydia. Patient to be discharged with instructions to follow up with PCP. Discussed importance of using protection when sexually active. Pt understands that they have GC/Chlamydia cultures pending and that they will need to inform all sexual partners if results return positive. Patient has been treated prophylactically with azithromycin and Rocephin.   Patient also has bilateral ear pain.   Based on age low suspicion for otitis media.  He has bilateral cerumen impactions for which he was seen 10 days ago and did not start the recommended Debrox.  I once again instructed him to use Debrox and on the importance of it.  I did attempt to remove his cerumen and removed a large amount of cerumen from his bilateral ears, however additional impacted cerumen still remains.  Again recommended Debrox drops.  Final Clinical Impressions(s) / ED Diagnoses   Final diagnoses:  Concern about STD in male without diagnosis  Bilateral impacted cerumen    ED Discharge Orders    None       Norman Clay 12/23/17 2227    Jacalyn Lefevre, MD 12/23/17 2232

## 2017-12-23 NOTE — Discharge Instructions (Signed)
Please get debrox drops at the drug store to help with your ear wax.   While in the ED your blood pressure was high.  Please follow up with your primary care doctor or the wellness clinic for repeat evaluation as you may need medication.  High blood pressure can cause long term, potentially serious, damage if left untreated.   Today you have been treated for gonorrhea and chlamydia.  The test to determine if you have these will take a few days. They will only call you if your tests come back positive, no news is good news. In the result that your tests are positive you have already been treated.

## 2017-12-24 LAB — RPR: RPR Ser Ql: NONREACTIVE

## 2017-12-24 LAB — HIV ANTIBODY (ROUTINE TESTING W REFLEX): HIV Screen 4th Generation wRfx: NONREACTIVE

## 2018-03-03 ENCOUNTER — Emergency Department (HOSPITAL_COMMUNITY)
Admission: EM | Admit: 2018-03-03 | Discharge: 2018-03-03 | Disposition: A | Discharge status: Home / Self Care | Payer: Self-pay | Attending: Emergency Medicine | Admitting: Emergency Medicine

## 2018-03-03 ENCOUNTER — Encounter (HOSPITAL_COMMUNITY): Payer: Self-pay | Admitting: Emergency Medicine

## 2018-03-03 DIAGNOSIS — I10 Essential (primary) hypertension: Secondary | ICD-10-CM | POA: Insufficient documentation

## 2018-03-03 DIAGNOSIS — R369 Urethral discharge, unspecified: Secondary | ICD-10-CM | POA: Insufficient documentation

## 2018-03-03 DIAGNOSIS — F17210 Nicotine dependence, cigarettes, uncomplicated: Secondary | ICD-10-CM | POA: Insufficient documentation

## 2018-03-03 LAB — URINALYSIS, ROUTINE W REFLEX MICROSCOPIC
Glucose, UA: NEGATIVE mg/dL
Ketones, ur: NEGATIVE mg/dL
Leukocytes, UA: NEGATIVE
Nitrite: NEGATIVE
Protein, ur: NEGATIVE mg/dL
Specific Gravity, Urine: 1.03 (ref 1.005–1.030)
pH: 6 (ref 5.0–8.0)

## 2018-03-03 MED ORDER — AZITHROMYCIN 250 MG PO TABS
1000.0000 mg | ORAL_TABLET | Freq: Once | ORAL | Status: AC
  Administered 2018-03-03: 1000 mg via ORAL
  Filled 2018-03-03: qty 4

## 2018-03-03 MED ORDER — STERILE WATER FOR INJECTION IJ SOLN
0.9000 mL | Freq: Once | INTRAMUSCULAR | Status: AC
  Administered 2018-03-03: 10 mL via INTRAMUSCULAR

## 2018-03-03 MED ORDER — CEFTRIAXONE SODIUM 250 MG IJ SOLR
250.0000 mg | Freq: Once | INTRAMUSCULAR | Status: AC
  Administered 2018-03-03: 250 mg via INTRAMUSCULAR
  Filled 2018-03-03: qty 250

## 2018-03-03 MED ORDER — STERILE WATER FOR INJECTION IJ SOLN
INTRAMUSCULAR | Status: AC
  Administered 2018-03-03: 10 mL via INTRAMUSCULAR
  Filled 2018-03-03: qty 10

## 2018-03-03 NOTE — ED Provider Notes (Signed)
[facility] Provider Note   CSN: [id number] Arrival date & time: 03/03/18  1118     History   Chief Complaint Chief Complaint  Patient presents with  . Exposure to STD    HPI Lance Williams is a 27 y.o. male.  HPI   27 year old male presents today with complaints of penile discharge.  Patient reports he woke up this morning with purulent penile discharge, denies any testicular pain abdominal pain fever chills nausea or vomiting.  He reports he is sexually active with one male partner.  Notes he has already contacted his partner and instructed her to be dilated.  Past Medical History:  Diagnosis Date  . Hypertension     Patient Active Problem List   Diagnosis Date Noted  . ELEVATED BLOOD PRESSURE 08/02/2009  . BEE STING 06/28/2008  . 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 No pertinent surgical history.      Home Medications    Prior to Admission medications   Medication Sig Start Date End Date Taking? Authorizing Provider  carbamide peroxide (DEBROX) 6.5 % OTIC solution Place 5 drops into both ears 2 (two) times daily. 12/12/17   Pisciotta, Joni ReiningNicole, PA-C  cyclobenzaprine (FLEXERIL) 10 MG tablet Take 1 tablet (10 mg total) by mouth 3 (three) times daily as needed for muscle spasms. 03/25/16   Beers, Charmayne Sheerharles M, PA-C  ibuprofen (ADVIL,MOTRIN) 800 MG tablet Take 1 tablet (800 mg total) by mouth every 8 (eight) hours as needed. 03/25/16   Beers, Charmayne Sheerharles M, PA-C    Family History History reviewed. No pertinent family history.  Social History Social History   Tobacco Use  . Smoking status: Current Every Day Smoker  . Smokeless tobacco: Never Used  Substance Use Topics  . Alcohol use: No  . Drug use: No     Allergies   Patient has no known allergies.   Review of Systems Review of Systems  All other systems reviewed and are negative.    Physical Exam Updated Vital Signs BP (!) 157/99 (BP Location: Right Arm)    Pulse 63   Temp 97.8 F (36.6 C) (Oral)   Resp 20   SpO2 100%   Physical Exam  Constitutional: He is oriented to person, place, and time. He appears well-developed and well-nourished.  HENT:  Head: Normocephalic and atraumatic.  Eyes: Pupils are equal, round, and reactive to light. Conjunctivae are normal. Right eye exhibits no discharge. Left eye exhibits no discharge. No scleral icterus.  Neck: Normal range of motion. No JVD present. No tracheal deviation present.  Pulmonary/Chest: Effort normal. No stridor.  Genitourinary:  Genitourinary Comments: Circumcised penis no purulent discharge noted  Neurological: He is alert and oriented to person, place, and time. Coordination normal.  Psychiatric: He has a normal mood and affect. His behavior is normal. Judgment and thought content normal.  Nursing note and vitals reviewed.    ED Treatments / Results  Labs (all labs ordered are listed, but only abnormal results are displayed) Labs Reviewed  URINALYSIS, ROUTINE W REFLEX MICROSCOPIC    EKG None  Radiology No results found.  Procedures Procedures (including critical care time)  Medications Ordered in ED Medications  cefTRIAXone (ROCEPHIN) injection 250 mg (has no administration in time range)  azithromycin (ZITHROMAX) tablet 1,000 mg (has no administration in time range)     Initial Impression / Assessment and Plan / ED Course  I have reviewed the triage vital signs and the nursing notes.  Pertinent  labs & imaging results that were available during my care of the patient were reviewed by me and considered in my medical decision making (see chart for details).     27 year old male presents today with penile discharge.  Suspect STD.  He will be tested and treated appropriately here.  Discharged with health department follow-up strict return precautions.  He verbalized understanding and agreement to today's plan.  Final Clinical Impressions(s) / ED Diagnoses   Final  diagnoses:  Penile discharge    ED Discharge Orders    None       Rosalio Loud 03/03/18 1255    Tilden Fossa, MD 03/04/18 1328

## 2018-03-03 NOTE — ED Triage Notes (Signed)
States woke up this morning with yellowish discharge from tip of penis. Denies pain. Having unprotected sex.

## 2018-03-03 NOTE — Discharge Instructions (Addendum)
Please read attached information. If you experience any new or worsening signs or symptoms please return to the emergency room for evaluation. Please follow-up with your primary care provider or specialist as discussed.  °

## 2018-03-04 LAB — GC/CHLAMYDIA PROBE AMP (~~LOC~~) NOT AT ARMC
Chlamydia: POSITIVE — AB
Neisseria Gonorrhea: NEGATIVE

## 2018-07-14 ENCOUNTER — Encounter (HOSPITAL_COMMUNITY): Payer: Self-pay

## 2018-07-14 ENCOUNTER — Emergency Department (HOSPITAL_COMMUNITY)
Admission: EM | Admit: 2018-07-14 | Discharge: 2018-07-14 | Disposition: A | Discharge status: Home / Self Care | Payer: Self-pay | Attending: Emergency Medicine | Admitting: Emergency Medicine

## 2018-07-14 ENCOUNTER — Other Ambulatory Visit: Payer: Self-pay

## 2018-07-14 DIAGNOSIS — F17210 Nicotine dependence, cigarettes, uncomplicated: Secondary | ICD-10-CM | POA: Insufficient documentation

## 2018-07-14 DIAGNOSIS — I10 Essential (primary) hypertension: Secondary | ICD-10-CM | POA: Insufficient documentation

## 2018-07-14 DIAGNOSIS — Z79899 Other long term (current) drug therapy: Secondary | ICD-10-CM | POA: Insufficient documentation

## 2018-07-14 DIAGNOSIS — Z202 Contact with and (suspected) exposure to infections with a predominantly sexual mode of transmission: Secondary | ICD-10-CM | POA: Insufficient documentation

## 2018-07-14 LAB — URINALYSIS, ROUTINE W REFLEX MICROSCOPIC
Bilirubin Urine: NEGATIVE
GLUCOSE, UA: NEGATIVE mg/dL
HGB URINE DIPSTICK: NEGATIVE
Ketones, ur: NEGATIVE mg/dL
NITRITE: NEGATIVE
Protein, ur: NEGATIVE mg/dL
SPECIFIC GRAVITY, URINE: 1.028 (ref 1.005–1.030)
pH: 7 (ref 5.0–8.0)

## 2018-07-14 MED ORDER — ACYCLOVIR 400 MG PO TABS: 400.0000 mg | ORAL_TABLET | Freq: Three times a day (TID) | ORAL | 0 refills | Status: AC

## 2018-07-14 NOTE — Discharge Instructions (Addendum)
Prescribed antivirals to treat your infection.  Please take 1 tablet 3 times a day for the next 7 days.  Gonorrhea and Chlamydia results will be called to you by the end of this week.  Please call if you do not hear from [place] by the end of the week.  Please abstain from sexual intercourse while this rash is present.

## 2018-07-14 NOTE — ED Provider Notes (Signed)
[facility] EMERGENCY DEPARTMENT Provider Note   CSN: [id number] Arrival date & time: 07/14/18  4098     History   Chief Complaint Chief Complaint  Patient presents with  . Exposure to STD    HPI Lance Williams is a 27 y.o. male.  27 y.o male with a PMH of HTN presents to the ED with a chief complaint of unconfirmed STI exposure.  Patient ports he got a call from his girlfriend this morning who told her she was having vaginal discharge and she reports she does not know if she has an STI at the moment.  Patient reports that he wanted to come in to get checked.  Patient also reports a rash that showed up these past couple days.  Reports the rash is not burning or itchy at this time.  He has not tried any therapy for his symptoms.  He denies any urinary, abdominal, penile discharge at this time.     Past Medical History:  Diagnosis Date  . Hypertension     Patient Active Problem List   Diagnosis Date Noted  . ELEVATED BLOOD PRESSURE 08/02/2009  . BEE STING 06/28/2008  . 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 No pertinent surgical history.      Home Medications    Prior to Admission medications   Medication Sig Start Date End Date Taking? Authorizing Provider  acyclovir (ZOVIRAX) 400 MG tablet Take 1 tablet (400 mg total) by mouth 3 (three) times daily for 7 days. 07/14/18 07/21/18  Claude Manges, PA-C  carbamide peroxide (DEBROX) 6.5 % OTIC solution Place 5 drops into both ears 2 (two) times daily. 12/12/17   Pisciotta, Joni Reining, PA-C  cyclobenzaprine (FLEXERIL) 10 MG tablet Take 1 tablet (10 mg total) by mouth 3 (three) times daily as needed for muscle spasms. 03/25/16   Beers, Charmayne Sheer, PA-C  ibuprofen (ADVIL,MOTRIN) 800 MG tablet Take 1 tablet (800 mg total) by mouth every 8 (eight) hours as needed. 03/25/16   Beers, Charmayne Sheer, PA-C    Family History History reviewed. No pertinent family history.  Social History Social History   Tobacco Use    . Smoking status: Current Every Day Smoker  . Smokeless tobacco: Never Used  Substance Use Topics  . Alcohol use: No  . Drug use: No     Allergies   Patient has no known allergies.   Review of Systems Review of Systems  Genitourinary: Negative for discharge, penile pain, scrotal swelling and testicular pain.  Skin: Positive for rash.     Physical Exam Updated Vital Signs BP (!) 146/104 (BP Location: Right Arm)   Pulse 70   Temp 98.6 F (37 C) (Oral)   Resp 16   Ht 5\' 9"  (1.753 m)   Wt 86.2 kg   SpO2 100%   BMI 28.06 kg/m   Physical Exam  Constitutional: He is oriented to person, place, and time. He appears well-developed and well-nourished.  HENT:  Head: Normocephalic and atraumatic.  Neck: Normal range of motion. Neck supple.  Cardiovascular: Normal heart sounds.  Pulmonary/Chest: Breath sounds normal.  Abdominal: Soft.  Genitourinary: Circumcised.  Genitourinary Comments: No penile discharge seen, drainage, testicular pain, or testicular swelling. A vesicular rash noted along the base of the penis and part of the glands.  Swab for HSV.  Musculoskeletal: He exhibits no tenderness.  Neurological: He is alert and oriented to person, place, and time.  Skin: Skin is warm and dry. Rash noted. Rash is  vesicular.  Nursing note and vitals reviewed.    ED Treatments / Results  Labs (all labs ordered are listed, but only abnormal results are displayed) Labs Reviewed  URINALYSIS, ROUTINE W REFLEX MICROSCOPIC - Abnormal; Notable for the following components:      Result Value   Leukocytes, UA TRACE (*)    Bacteria, UA FEW (*)    All other components within normal limits  HSV CULTURE AND TYPING  GC/CHLAMYDIA PROBE AMP ([place]) NOT AT [facility]    EKG None  Radiology No results found.  Procedures Procedures (including critical care time)  Medications Ordered in ED Medications - No data to display   Initial Impression / Assessment and Plan / ED Course   I have reviewed the triage vital signs and the nursing notes.  Pertinent labs & imaging results that were available during my care of the patient were reviewed by me and considered in my medical decision making (see chart for details).     She presents with a possible STD exposure per his ex-girlfriend.  Patient denies any penile discharge, testicular swelling, testicular pain.  He does report a vesicular rash along the glans of his penis but denies any burning or itching with this rash.  Upon examination there is a vesicular rash along the base of the penis and glans region.  I have obtain a viral swab for HSV to.  Low suspicion for syphilis as there are no ulcerations, no flulike symptoms noted. 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   Final Clinical Impressions(s) / ED Diagnoses   Final diagnoses:  STD exposure    ED Discharge Orders         Ordered    acyclovir (ZOVIRAX) 400 MG tablet  3 times daily     07/14/18 1159           Claude Manges, PA-C 07/14/18 1210    Benjiman Core, MD 07/14/18 1555

## 2018-07-14 NOTE — ED Triage Notes (Signed)
Pt reports that his ex girlfriend texted him last night and stated that she's having discharge maybe clear/cloudy and also pelvic pain.   Pt denies any known exposure to STD's and denies symptoms.  However wants to be checked to be sure.

## 2018-07-15 LAB — GC/CHLAMYDIA PROBE AMP (~~LOC~~) NOT AT ARMC
CHLAMYDIA, DNA PROBE: NEGATIVE
Neisseria Gonorrhea: NEGATIVE

## 2018-07-16 LAB — HSV CULTURE AND TYPING

## 2019-01-24 ENCOUNTER — Emergency Department (HOSPITAL_COMMUNITY): Payer: Worker's Compensation

## 2019-01-24 ENCOUNTER — Encounter (HOSPITAL_COMMUNITY): Payer: Self-pay | Admitting: *Deleted

## 2019-01-24 ENCOUNTER — Emergency Department (HOSPITAL_COMMUNITY)
Admission: EM | Admit: 2019-01-24 | Discharge: 2019-01-24 | Disposition: A | Discharge status: Home / Self Care | Payer: Self-pay | Attending: Emergency Medicine | Admitting: Emergency Medicine

## 2019-01-24 ENCOUNTER — Other Ambulatory Visit: Payer: Self-pay

## 2019-01-24 DIAGNOSIS — I10 Essential (primary) hypertension: Secondary | ICD-10-CM | POA: Insufficient documentation

## 2019-01-24 DIAGNOSIS — Y929 Unspecified place or not applicable: Secondary | ICD-10-CM | POA: Insufficient documentation

## 2019-01-24 DIAGNOSIS — F17200 Nicotine dependence, unspecified, uncomplicated: Secondary | ICD-10-CM | POA: Insufficient documentation

## 2019-01-24 DIAGNOSIS — S41111A Laceration without foreign body of right upper arm, initial encounter: Secondary | ICD-10-CM

## 2019-01-24 DIAGNOSIS — Y9389 Activity, other specified: Secondary | ICD-10-CM | POA: Insufficient documentation

## 2019-01-24 DIAGNOSIS — W25XXXA Contact with sharp glass, initial encounter: Secondary | ICD-10-CM | POA: Insufficient documentation

## 2019-01-24 DIAGNOSIS — Z23 Encounter for immunization: Secondary | ICD-10-CM | POA: Insufficient documentation

## 2019-01-24 DIAGNOSIS — S51811A Laceration without foreign body of right forearm, initial encounter: Secondary | ICD-10-CM | POA: Insufficient documentation

## 2019-01-24 DIAGNOSIS — Y999 Unspecified external cause status: Secondary | ICD-10-CM | POA: Insufficient documentation

## 2019-01-24 MED ORDER — ONDANSETRON HCL 4 MG PO TABS
4.0000 mg | ORAL_TABLET | Freq: Once | ORAL | Status: AC
  Administered 2019-01-24: 4 mg via ORAL
  Filled 2019-01-24: qty 1

## 2019-01-24 MED ORDER — LIDOCAINE-EPINEPHRINE (PF) 2 %-1:200000 IJ SOLN
30.0000 mL | Freq: Once | INTRAMUSCULAR | Status: AC
  Administered 2019-01-24: 30 mL
  Filled 2019-01-24: qty 40

## 2019-01-24 MED ORDER — TETANUS-DIPHTH-ACELL PERTUSSIS 5-2.5-18.5 LF-MCG/0.5 IM SUSP
0.5000 mL | Freq: Once | INTRAMUSCULAR | Status: AC
  Administered 2019-01-24: 0.5 mL via INTRAMUSCULAR
  Filled 2019-01-24: qty 0.5

## 2019-01-24 MED ORDER — HYDROCODONE-ACETAMINOPHEN 5-325 MG PO TABS
2.0000 | ORAL_TABLET | Freq: Once | ORAL | Status: AC
  Administered 2019-01-24: 2 via ORAL
  Filled 2019-01-24: qty 2

## 2019-01-24 MED ORDER — HYDROCODONE-ACETAMINOPHEN 5-325 MG PO TABS: 1.0000 | ORAL_TABLET | Freq: Four times a day (QID) | ORAL | 0 refills | Status: AC | PRN

## 2019-01-24 NOTE — ED Notes (Signed)
Patient transported to X-ray 

## 2019-01-24 NOTE — ED Triage Notes (Signed)
EMS reported Pt was moving a glass table top with others .While lifting  Glass into truck the glass fell on his arm. Bleeding controled on arrival to room.

## 2019-01-24 NOTE — Discharge Instructions (Addendum)
Thank you for allowing me to care for you today. Please return to the emergency department if you have new or worsening symptoms. Take your medications as instructed.  ° °

## 2019-01-24 NOTE — ED Notes (Signed)
Patient verbalizes understanding of discharge instructions . Opportunity for questions and answers were provided . Armband removed by staff ,Pt discharged from ED. W/C  offered at D/C  and Declined W/C at D/C and was escorted to lobby by RN.  

## 2019-01-24 NOTE — ED Provider Notes (Signed)
[facility]   CSN: [id number] Arrival date & time: 01/24/19  1033    History   Chief Complaint Chief Complaint  Patient presents with   Arm Injury    HPI Lance Williams is a 28 y.o. male.     Patient is a 28 year old male with no significant past medical history presenting to the emergency department for laceration to the right arm.  Patient reports that he was helping to move a glass table today when it fell and broke on his arm.  Reports a large laceration to the dorsum of his forearm.  Reports pain and difficulty with extension of his wrist and fingers.     Past Medical History:  Diagnosis Date   Hypertension     Patient Active Problem List   Diagnosis Date Noted   ELEVATED BLOOD PRESSURE 08/02/2009   BEE STING 06/28/2008   ACNE, MILD 03/18/2007    History reviewed. No pertinent surgical history.      Home Medications    Prior to Admission medications   Medication Sig Start Date End Date Taking? Authorizing Provider  carbamide peroxide (DEBROX) 6.5 % OTIC solution Place 5 drops into both ears 2 (two) times daily. 12/12/17   Pisciotta, Joni ReiningNicole, PA-C  cyclobenzaprine (FLEXERIL) 10 MG tablet Take 1 tablet (10 mg total) by mouth 3 (three) times daily as needed for muscle spasms. 03/25/16   Beers, Charmayne Sheerharles M, PA-C  HYDROcodone-acetaminophen (NORCO/VICODIN) 5-325 MG tablet Take 1 tablet by mouth every 6 (six) hours as needed for up to 2 days. 01/24/19 01/26/19  Arlyn DunningMcLean, Erica Osuna A, PA-C  ibuprofen (ADVIL,MOTRIN) 800 MG tablet Take 1 tablet (800 mg total) by mouth every 8 (eight) hours as needed. 03/25/16   Beers, Charmayne Sheerharles M, PA-C    Family History History reviewed. No pertinent family history.  Social History Social History   Tobacco Use   Smoking status: Current Every Day Smoker   Smokeless tobacco: Never Used  Substance Use Topics   Alcohol use: No   Drug use: No     Allergies   Patient has no known  allergies.   Review of Systems Review of Systems  Constitutional: Negative for fever.  Skin: Positive for wound.  Allergic/Immunologic: Negative for immunocompromised state.  Neurological: Negative for dizziness and light-headedness.  Hematological: Does not bruise/bleed easily.     Physical Exam Updated Vital Signs BP (!) 145/94 (BP Location: Left Arm)    Pulse 63    Temp 97.9 F (36.6 C) (Oral)    Resp 16    Ht 5\' 8"  (1.727 m)    Wt 88.5 kg    SpO2 100%    BMI 29.65 kg/m   Physical Exam Vitals signs and nursing note reviewed.  Constitutional:      Appearance: Normal appearance.  HENT:     Head: Normocephalic.     Mouth/Throat:     Mouth: Mucous membranes are moist.  Eyes:     Conjunctiva/sclera: Conjunctivae normal.  Pulmonary:     Effort: Pulmonary effort is normal.  Musculoskeletal:     Right elbow: Normal.    Comments: Patient has normal strength and ROM of all digits and the wrist. Specifically he has good strength with extension of wrist and all fingers.   Large laceration over the dorsum of the hand. See photos. Does not appear to have tendon involvement. He does have decreased sensation in a radial distribution of the dorsum of the hand  Skin:  General: Skin is dry.     Comments: 8 cm laceration with skin flap over the dorsum of the right medial forearm.  Tendon exposed.  Bleeding controlled.  Normal distal radius pulses.  Patient's wrist appears to be in a flexed position he is having difficulty with extension of the hand and fingers  Neurological:     Mental Status: He is alert.  Psychiatric:        Mood and Affect: Mood normal.            ED Treatments / Results  Labs (all labs ordered are listed, but only abnormal results are displayed) Labs Reviewed - No data to display  EKG None  Radiology Dg Forearm Right  Result Date: 01/24/2019 CLINICAL DATA:  Acute RIGHT forearm pain following laceration today. Initial encounter. EXAM: RIGHT FOREARM  - 2 VIEW COMPARISON:  None. FINDINGS: There is no evidence of acute fracture, subluxation or dislocation. No definite radiopaque foreign body noted. Soft tissue swelling/irregularity of the mid-distal forearm is compatible with known soft tissue injury. IMPRESSION: Soft tissue injury without evidence of acute bony abnormality or definite radiopaque foreign body. Electronically Signed   By: Harmon Pier M.D.   On: 01/24/2019 11:15    Procedures .Marland KitchenLaceration Repair Date/Time: 01/24/2019 2:07 PM Performed by: Arlyn Dunning, PA-C Authorized by: Arlyn Dunning, PA-C   Consent:    Consent obtained:  Verbal   Consent given by:  Patient   Risks discussed:  Infection, need for additional repair, pain, poor cosmetic result, poor wound healing, tendon damage, vascular damage and nerve damage   Alternatives discussed:  No treatment, delayed treatment and referral Universal protocol:    Procedure explained and questions answered to patient or proxy's satisfaction: yes     Relevant documents present and verified: yes     Test results available and properly labeled: yes     Imaging studies available: yes     Required blood products, implants, devices, and special equipment available: yes     Site/side marked: yes     Immediately prior to procedure, a time out was called: yes     Patient identity confirmed:  Verbally with patient Anesthesia (see MAR for exact dosages):    Anesthesia method:  Local infiltration   Local anesthetic:  Lidocaine 2% WITH epi Laceration details:    Location:  Shoulder/arm   Shoulder/arm location:  R lower arm   Length (cm):  13   Depth (mm):  10 Repair type:    Repair type:  Intermediate Pre-procedure details:    Preparation:  Patient was prepped and draped in usual sterile fashion Exploration:    Hemostasis achieved with:  Direct pressure and epinephrine   Wound exploration: wound explored through full range of motion and entire depth of wound probed and visualized       Wound extent: no nerve damage noted, no tendon damage noted, no underlying fracture noted and no vascular damage noted     Contaminated: no   Treatment:    Area cleansed with:  Betadine, Shur-Clens and saline   Amount of cleaning:  Extensive   Irrigation solution:  Sterile saline   Irrigation method:  Syringe Subcutaneous repair:    Suture size:  4-0   Suture material:  Vicryl   Suture technique:  Simple interrupted   Number of sutures:  14 Skin repair:    Repair method:  Sutures   Suture size:  4-0   Suture material:  Nylon   Number of  sutures:  23 Approximation:    Approximation:  Close Post-procedure details:    Dressing:  Antibiotic ointment and bulky dressing   Patient tolerance of procedure:  Tolerated well, no immediate complications   (including critical care time)  Medications Ordered in ED Medications  Tdap (BOOSTRIX) injection 0.5 mL (0.5 mLs Intramuscular Given 01/24/19 1059)  HYDROcodone-acetaminophen (NORCO/VICODIN) 5-325 MG per tablet 2 tablet (2 tablets Oral Given 01/24/19 1058)  ondansetron (ZOFRAN) tablet 4 mg (4 mg Oral Given 01/24/19 1058)  lidocaine-EPINEPHrine (XYLOCAINE W/EPI) 2 %-1:200000 (PF) injection 30 mL (30 mLs Infiltration Given 01/24/19 1215)     Initial Impression / Assessment and Plan / ED Course  I have reviewed the triage vital signs and the nursing notes.  Pertinent labs & imaging results that were available during my care of the patient were reviewed by me and considered in my medical decision making (see chart for details).  Clinical Course as of Jan 24 1415  Sun Jan 24, 2019  1239 Waiting for consult from hand at this time.    [KM]  1358 I spoke with Dr. Eulah Pont from orthopedic hand surgery who reviewed the patient with me and reviewed the photos in the chart.  Reported that patient can be closed here in the emergency department and follow-up with him outpatient.  If unable to close in the emergency department he offered to see him in  the OR.  I was able to get easy closure of the wound here in the emergency department and I spoke with the patient about wound care and following up with hand outpatient. Neurovascularly intact   [KM]    Clinical Course User Index [KM] Arlyn Dunning, PA-C       Based on review of vitals, medical screening exam, lab work and/or imaging, there does not appear to be an acute, emergent etiology for the patient's symptoms. Counseled pt on good return precautions and encouraged both PCP and ED follow-up as needed.  Prior to discharge, I also discussed incidental imaging findings with patient in detail and advised appropriate, recommended follow-up in detail.  Clinical Impression: 1. Arm laceration, right, initial encounter     Disposition: Discharge  Prior to providing a prescription for a controlled substance, I independently reviewed the patient's recent prescription history on the [place] Controlled Substance Reporting System. The patient had no recent or regular prescriptions and was deemed appropriate for a brief, less than 3 day prescription of narcotic for acute analgesia.  This note was prepared with assistance of Conservation officer, historic buildings. Occasional wrong-word or sound-a-like substitutions may have occurred due to the inherent limitations of voice recognition software.   Final Clinical Impressions(s) / ED Diagnoses   Final diagnoses:  Arm laceration, right, initial encounter    ED Discharge Orders         Ordered    HYDROcodone-acetaminophen (NORCO/VICODIN) 5-325 MG tablet  Every 6 hours PRN     01/24/19 1405           Jeral Pinch 01/24/19 1416    Benjiman Core, MD 01/24/19 1511

## 2019-06-04 ENCOUNTER — Other Ambulatory Visit: Payer: Self-pay

## 2019-06-04 ENCOUNTER — Emergency Department (HOSPITAL_COMMUNITY)
Admission: EM | Admit: 2019-06-04 | Discharge: 2019-06-04 | Disposition: A | Discharge status: Home / Self Care | Payer: Self-pay | Attending: Emergency Medicine | Admitting: Emergency Medicine

## 2019-06-04 ENCOUNTER — Encounter (HOSPITAL_COMMUNITY): Payer: Self-pay | Admitting: Emergency Medicine

## 2019-06-04 DIAGNOSIS — F17200 Nicotine dependence, unspecified, uncomplicated: Secondary | ICD-10-CM | POA: Insufficient documentation

## 2019-06-04 DIAGNOSIS — Z79899 Other long term (current) drug therapy: Secondary | ICD-10-CM | POA: Insufficient documentation

## 2019-06-04 DIAGNOSIS — Z113 Encounter for screening for infections with a predominantly sexual mode of transmission: Secondary | ICD-10-CM | POA: Insufficient documentation

## 2019-06-04 DIAGNOSIS — R369 Urethral discharge, unspecified: Secondary | ICD-10-CM | POA: Insufficient documentation

## 2019-06-04 DIAGNOSIS — I10 Essential (primary) hypertension: Secondary | ICD-10-CM | POA: Insufficient documentation

## 2019-06-04 LAB — URINALYSIS, ROUTINE W REFLEX MICROSCOPIC
Bilirubin Urine: NEGATIVE
Glucose, UA: NEGATIVE mg/dL
Ketones, ur: NEGATIVE mg/dL
Nitrite: NEGATIVE
Protein, ur: NEGATIVE mg/dL
Specific Gravity, Urine: 1.028 (ref 1.005–1.030)
WBC, UA: >50 WBC/hpf — ABNORMAL HIGH (ref 0–5)
pH: 5 (ref 5.0–8.0)

## 2019-06-04 MED ORDER — LIDOCAINE HCL (PF) 1 % IJ SOLN
INTRAMUSCULAR | Status: AC
  Administered 2019-06-04: 1 mL
  Filled 2019-06-04: qty 5

## 2019-06-04 MED ORDER — AZITHROMYCIN 250 MG PO TABS
1000.0000 mg | ORAL_TABLET | Freq: Once | ORAL | Status: AC
  Administered 2019-06-04: 1000 mg via ORAL
  Filled 2019-06-04: qty 4

## 2019-06-04 MED ORDER — CEFTRIAXONE SODIUM 250 MG IJ SOLR
250.0000 mg | Freq: Once | INTRAMUSCULAR | Status: AC
  Administered 2019-06-04: 250 mg via INTRAMUSCULAR
  Filled 2019-06-04: qty 250

## 2019-06-04 NOTE — ED Provider Notes (Signed)
[facility] EMERGENCY DEPARTMENT Provider Note   CSN: [id number] Arrival date & time: 06/04/19  2056     History   Chief Complaint Chief Complaint  Patient presents with  . Penile Discharge    HPI Lance Williams is a 28 y.o. male presenting for evaluation of penile discharge.  Patient states he noticed penile discharge this afternoon.  He describes as thin and white.  He also reports some discomfort with urination at the tip of his penis.  He denies fevers, chills, nausea, vomiting, abdominal pain.  He has been sexually active with a new partner since he broke up with his girlfriend 6 or 7 months ago.  They do not use protection. He denies hematuria or urinary frequency. Pt reports a h/o htn, does not take anything for this. No other medical problems. He denies testicular pain or swelling. No penile shaft pain or swelling.      HPI  Past Medical History:  Diagnosis Date  . Hypertension     Patient Active Problem List   Diagnosis Date Noted  . ELEVATED BLOOD PRESSURE 08/02/2009  . BEE STING 06/28/2008  . 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 No pertinent surgical history.      Home Medications    Prior to Admission medications   Medication Sig Start Date End Date Taking? Authorizing Provider  carbamide peroxide (DEBROX) 6.5 % OTIC solution Place 5 drops into both ears 2 (two) times daily. 12/12/17   Pisciotta, Elmyra Ricks, PA-C  cyclobenzaprine (FLEXERIL) 10 MG tablet Take 1 tablet (10 mg total) by mouth 3 (three) times daily as needed for muscle spasms. 03/25/16   Beers, Pierce Crane, PA-C  ibuprofen (ADVIL,MOTRIN) 800 MG tablet Take 1 tablet (800 mg total) by mouth every 8 (eight) hours as needed. 03/25/16   Arlyss Repress, PA-C    Family History No family history on file.  Social History Social History   Tobacco Use  . Smoking status: Current Every Day Smoker  . Smokeless tobacco: Never Used  Substance Use Topics  . Alcohol use: No  .  Drug use: No     Allergies   Patient has no known allergies.   Review of Systems Review of Systems  Constitutional: Negative for fever.  Gastrointestinal: Negative for abdominal pain, nausea and vomiting.  Genitourinary: Positive for discharge.     Physical Exam Updated Vital Signs BP (!) 159/101   Pulse 65   Temp 98.2 F (36.8 C) (Oral)   Resp 16   Ht 5\' 9"  (1.753 m)   Wt 88.5 kg   SpO2 100%   BMI 28.80 kg/m   Physical Exam Vitals signs and nursing note reviewed. Exam conducted with a chaperone present.  Constitutional:      General: He is not in acute distress.    Appearance: He is well-developed.  HENT:     Head: Normocephalic and atraumatic.  Neck:     Musculoskeletal: Normal range of motion.  Pulmonary:     Effort: Pulmonary effort is normal.  Abdominal:     General: There is no distension.     Comments: No tenderness palpation of the abdomen.  No rigidity, guarding, distention.  Genitourinary:    Penis: Circumcised. Discharge present.      Scrotum/Testes: Normal.     Epididymis:     Right: Normal.     Left: Normal.     Comments: No inguinal lymphadenopathy.  No testicular welling or tenderness.  No penile shaft tenderness.  Thin white discharge noted from meatus. Musculoskeletal: Normal range of motion.  Lymphadenopathy:     Lower Body: No right inguinal adenopathy. No left inguinal adenopathy.  Skin:    General: Skin is warm.     Findings: No rash.  Neurological:     Mental Status: He is alert and oriented to person, place, and time.      ED Treatments / Results  Labs (all labs ordered are listed, but only abnormal results are displayed) Labs Reviewed  URINALYSIS, ROUTINE W REFLEX MICROSCOPIC - Abnormal; Notable for the following components:      Result Value   APPearance HAZY (*)    Hgb urine dipstick SMALL (*)    Leukocytes,Ua MODERATE (*)    WBC, UA >50 (*)    Bacteria, UA RARE (*)    All other components within normal limits   GC/CHLAMYDIA PROBE AMP ([place]) NOT AT [facility]    EKG None  Radiology No results found.  Procedures Procedures (including critical care time)  Medications Ordered in ED Medications  cefTRIAXone (ROCEPHIN) injection 250 mg (has no administration in time range)  azithromycin (ZITHROMAX) tablet 1,000 mg (has no administration in time range)     Initial Impression / Assessment and Plan / ED Course  I have reviewed the triage vital signs and the nursing notes.  Pertinent labs & imaging results that were available during my care of the patient were reviewed by me and considered in my medical decision making (see chart for details).        Patient presenting for evaluation of penile discharge.  Physical exam shows patient appears nontoxic.  No abdominal tenderness.  No urinary symptoms to indicate UTI.  On exam, patient has white discharge noted in the meatus.  Urine obtained in triage shows greater than 50 white cells.  While it does have moderate leuks and rare bacteria, I suspect this is from an STD rather than a UTI.  Gonorrhea and chlamydia testing sent.  Patient treated, as he currently has symptoms.  Encourage follow-up with health department.  Discussed safe sex practices.  At this time, patient appears safe for discharge. return precautions given.  Patient states he understands and agrees to plan.   Final Clinical Impressions(s) / ED Diagnoses   Final diagnoses:  Penile discharge    ED Discharge Orders    None       Alveria ApleyCaccavale, Ronnie Mallette, PA-C 06/04/19 2256    Wynetta FinesMessick, Peter C, MD 06/05/19 [phone]

## 2019-06-04 NOTE — ED Triage Notes (Signed)
Pt to ED with c/o discharge from penis, onset approx 1 hour ago

## 2019-06-04 NOTE — Discharge Instructions (Addendum)
You were treated for vomiting, we will follow. We will test for gonorrhea and chlamydia is pending.  If positive, you do not need further treatment, but you will need to let medical partners know. If negative, you will not receive a phone call.  Either way, you may check online on MyChart.  Instructions to set up your MyChart account or in the paperwork. Abstain from sex for the next 2 weeks prevent reinfection. Follow-up with the health department if symptoms not improving. Return to the emergency room with any new, worsening, concerning symptoms.

## 2019-06-06 ENCOUNTER — Other Ambulatory Visit: Payer: Self-pay | Admitting: Student

## 2019-06-08 LAB — CYTOLOGY, (ORAL, ANAL, URETHRAL) ANCILLARY ONLY
Chlamydia: NEGATIVE
Neisseria Gonorrhea: POSITIVE — AB

## 2019-06-11 ENCOUNTER — Telehealth (HOSPITAL_COMMUNITY): Payer: Self-pay

## 2020-01-27 DIAGNOSIS — M545 Low back pain: Secondary | ICD-10-CM | POA: Insufficient documentation

## 2020-01-27 DIAGNOSIS — Z5321 Procedure and treatment not carried out due to patient leaving prior to being seen by health care provider: Secondary | ICD-10-CM | POA: Insufficient documentation

## 2020-01-27 DIAGNOSIS — M25561 Pain in right knee: Secondary | ICD-10-CM | POA: Insufficient documentation

## 2020-01-28 ENCOUNTER — Emergency Department (HOSPITAL_COMMUNITY)
Admission: EM | Admit: 2020-01-28 | Discharge: 2020-01-28 | Disposition: A | Discharge status: Home / Self Care | Payer: Self-pay | Attending: Emergency Medicine | Admitting: Emergency Medicine

## 2020-01-28 ENCOUNTER — Emergency Department (HOSPITAL_COMMUNITY): Payer: Self-pay

## 2020-01-28 ENCOUNTER — Encounter (HOSPITAL_COMMUNITY): Payer: Self-pay | Admitting: Emergency Medicine

## 2020-01-28 ENCOUNTER — Other Ambulatory Visit: Payer: Self-pay
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
## 2020-01-28 NOTE — ED Notes (Signed)
Pt refusing to stay and is taking girlfriend with him though she is still concerned of head injury.

## 2021-09-19 ENCOUNTER — Ambulatory Visit
Admission: EM | Admit: 2021-09-19 | Discharge: 2021-09-19 | Disposition: A | Discharge status: Home / Self Care | Payer: Self-pay | Attending: Internal Medicine | Admitting: Internal Medicine

## 2021-09-19 ENCOUNTER — Other Ambulatory Visit: Payer: Self-pay

## 2021-09-19 ENCOUNTER — Ambulatory Visit (INDEPENDENT_AMBULATORY_CARE_PROVIDER_SITE_OTHER): Payer: Self-pay

## 2021-09-19 DIAGNOSIS — M25531 Pain in right wrist: Secondary | ICD-10-CM

## 2021-09-19 NOTE — ED Triage Notes (Signed)
One week ago, while patient was moving furniture he hit his right wrist on an elevator door causing a gradual onset of right wrist pain. Confirms swelling and bruising. Pt complains of right wrist and forearm tightness and pulsating. Notes pain with ROM. Has been taking aleve w/o relief.

## 2021-09-19 NOTE — ED Provider Notes (Addendum)
[facility]    CSN: [id number] Arrival date & time: 09/19/21  1301      History   Chief Complaint Chief Complaint  Patient presents with   Wrist Pain    right    HPI Lance Williams is a 31 y.o. male.   Presents for further evaluation of right wrist pain that has been present for approximately 1 week after an injury.  Patient reports that he was moving furniture when he hit his right wrist against an elevator door.  He has also has some associated swelling noted to area.  Taking Tylenol and Aleve with minimal improvement in pain.  Denies any numbness or tingling.  Pain occurs with range of motion.   Wrist Pain   Past Medical History:  Diagnosis Date   Hypertension     Patient Active Problem List   Diagnosis Date Noted   ELEVATED BLOOD PRESSURE 08/02/2009   BEE STING 06/28/2008   ACNE, MILD 03/18/2007    History reviewed. No pertinent surgical history.     Home Medications    Prior to Admission medications   Medication Sig Start Date End Date Taking? Authorizing Provider  carbamide peroxide (DEBROX) 6.5 % OTIC solution Place 5 drops into both ears 2 (two) times daily. 12/12/17   Pisciotta, Joni Reining, PA-C  cyclobenzaprine (FLEXERIL) 10 MG tablet Take 1 tablet (10 mg total) by mouth 3 (three) times daily as needed for muscle spasms. 03/25/16   Beers, Charmayne Sheer, PA-C  ibuprofen (ADVIL,MOTRIN) 800 MG tablet Take 1 tablet (800 mg total) by mouth every 8 (eight) hours as needed. 03/25/16   Beers, Charmayne Sheer, PA-C    Family History History reviewed. No pertinent family history.  Social History Social History   Tobacco Use   Smoking status: Every Day   Smokeless tobacco: Never  Substance Use Topics   Alcohol use: No   Drug use: No     Allergies   Patient has no known allergies.   Review of Systems Review of Systems Per HPI  Physical Exam Triage Vital Signs ED Triage Vitals  Enc Vitals Group     BP 09/19/21 1334 (!) 174/113     Pulse Rate  09/19/21 1327 70     Resp 09/19/21 1327 18     Temp 09/19/21 1327 98.1 F (36.7 C)     Temp Source 09/19/21 1327 Oral     SpO2 09/19/21 1327 98 %     Weight --      Height --      Head Circumference --      Peak Flow --      Pain Score 09/19/21 1332 9     Pain Loc --      Pain Edu? --      Excl. in GC? --    No data found.  Updated Vital Signs BP (!) 168/113    Pulse 70    Temp 98.1 F (36.7 C) (Oral)    Resp 18    SpO2 98%   Visual Acuity Right Eye Distance:   Left Eye Distance:   Bilateral Distance:    Right Eye Near:   Left Eye Near:    Bilateral Near:     Physical Exam Constitutional:      General: He is not in acute distress.    Appearance: Normal appearance. He is not toxic-appearing or diaphoretic.  HENT:     Head: Normocephalic and atraumatic.  Eyes:     Extraocular Movements: Extraocular  movements intact.     Conjunctiva/sclera: Conjunctivae normal.  Pulmonary:     Effort: Pulmonary effort is normal.  Musculoskeletal:     Right wrist: Swelling and tenderness present. No deformity, bony tenderness, snuff box tenderness or crepitus. Normal pulse.     Left wrist: Normal.     Comments: Patient has tenderness to palpation as well as mild erythema and mild swelling noted to anterior wrist/forearm to the right upper extremity.  Neurovascular intact.  Neurological:     General: No focal deficit present.     Mental Status: He is alert and oriented to person, place, and time. Mental status is at baseline.  Psychiatric:        Mood and Affect: Mood normal.        Behavior: Behavior normal.        Thought Content: Thought content normal.        Judgment: Judgment normal.     UC Treatments / Results  Labs (all labs ordered are listed, but only abnormal results are displayed) Labs Reviewed - No data to display  EKG   Radiology No results found.  Procedures Procedures (including critical care time)  Medications Ordered in UC Medications - No data to  display  Initial Impression / Assessment and Plan / UC Course  I have reviewed the triage vital signs and the nursing notes.  Pertinent labs & imaging results that were available during my care of the patient were reviewed by me and considered in my medical decision making (see chart for details).     X-ray was negative for any acute bony abnormality.  Suspect contusion to right wrist.  Patient to use ice application and over-the-counter pain relievers.  Provided patient with contact information for orthopedist for follow-up if pain persists or worsens.  Patient also has elevated blood pressure reading but it appears that this could be attributed to pain.  No signs of hypertensive urgency, neuro exam is normal, no signs of endorgan damage.  Advised patient to get home blood pressure cuff and to monitor blood pressure at home and to follow-up accordingly.  Discussed return precautions.  Patient is neurovascularly intact.  Patient verbalized understanding and was agreeable with plan. Final Clinical Impressions(s) / UC Diagnoses   Final diagnoses:  Right wrist pain     Discharge Instructions      X-rays were negative.  Please use ice application to affected area.  Call provided contact information for orthopedist if pain persists.     ED Prescriptions   None    PDMP not reviewed this encounter.   Gustavus Bryant, [place] 09/19/21 1444    Gustavus Bryant, [place] 09/19/21 1452

## 2021-09-19 NOTE — Discharge Instructions (Addendum)
X-rays were negative.  Please use ice application to affected area.  Call provided contact information for orthopedist if pain persists.

## 2021-11-17 ENCOUNTER — Encounter (HOSPITAL_COMMUNITY): Payer: Self-pay | Admitting: Emergency Medicine

## 2021-11-17 ENCOUNTER — Other Ambulatory Visit: Payer: Self-pay

## 2021-11-17 ENCOUNTER — Emergency Department (HOSPITAL_COMMUNITY)
Admission: EM | Admit: 2021-11-17 | Discharge: 2021-11-17 | Disposition: A | Discharge status: Home / Self Care | Payer: Self-pay | Attending: Emergency Medicine | Admitting: Emergency Medicine

## 2021-11-17 DIAGNOSIS — A64 Unspecified sexually transmitted disease: Secondary | ICD-10-CM | POA: Insufficient documentation

## 2021-11-17 DIAGNOSIS — R369 Urethral discharge, unspecified: Secondary | ICD-10-CM

## 2021-11-17 DIAGNOSIS — Z711 Person with feared health complaint in whom no diagnosis is made: Secondary | ICD-10-CM

## 2021-11-17 LAB — URINALYSIS, ROUTINE W REFLEX MICROSCOPIC
Bilirubin Urine: NEGATIVE
Glucose, UA: NEGATIVE mg/dL
Ketones, ur: NEGATIVE mg/dL
Nitrite: NEGATIVE
Protein, ur: NEGATIVE mg/dL
Specific Gravity, Urine: 1.02 (ref 1.005–1.030)
WBC, UA: >50 WBC/hpf — ABNORMAL HIGH (ref 0–5)
pH: 6 (ref 5.0–8.0)

## 2021-11-17 LAB — HIV ANTIBODY (ROUTINE TESTING W REFLEX): HIV Screen 4th Generation wRfx: NONREACTIVE

## 2021-11-17 MED ORDER — METRONIDAZOLE 500 MG PO TABS
2000.0000 mg | ORAL_TABLET | Freq: Once | ORAL | Status: AC
  Administered 2021-11-17: 2000 mg via ORAL
  Filled 2021-11-17: qty 4

## 2021-11-17 MED ORDER — ONDANSETRON 4 MG PO TBDP
4.0000 mg | ORAL_TABLET | Freq: Once | ORAL | Status: AC
  Administered 2021-11-17: 4 mg via ORAL
  Filled 2021-11-17: qty 1

## 2021-11-17 MED ORDER — CEFTRIAXONE SODIUM 500 MG IJ SOLR
500.0000 mg | Freq: Once | INTRAMUSCULAR | Status: AC
  Administered 2021-11-17: 500 mg via INTRAMUSCULAR
  Filled 2021-11-17: qty 500

## 2021-11-17 MED ORDER — AZITHROMYCIN 250 MG PO TABS
1000.0000 mg | ORAL_TABLET | Freq: Once | ORAL | Status: AC
  Administered 2021-11-17: 1000 mg via ORAL
  Filled 2021-11-17: qty 4

## 2021-11-17 MED ORDER — LIDOCAINE HCL (PF) 1 % IJ SOLN
INTRAMUSCULAR | Status: AC
  Administered 2021-11-17: 2 mL
  Filled 2021-11-17: qty 5

## 2021-11-17 NOTE — ED Triage Notes (Signed)
C/o penile discharge and pain with urination since yesterday.  States his ex is at [employer] for STD. ?

## 2021-11-17 NOTE — ED Provider Notes (Signed)
?[facility] EMERGENCY DEPARTMENT ?Provider Note ? ? ?CSN: [id number] ?Arrival date & time: 11/17/21  1716 ? ?  ? ?History ? ?Chief Complaint  ?Patient presents with  ? SEXUALLY TRANSMITTED DISEASE  ? ? ?Lance Williams is a 31 y.o. male here for STD check.  Significant other recently tested positive for STD.  He is not sure what this is however sounds like trichomonas.  He did note yellow discharge to his penis earlier this morning.  No pain with bowel movements.  Only sexually active with females, intermittently uses protection.  No fever, abdominal pain, dysuria, hematuria, flank pain.  No meds PTA. ? ?HPI ? ?  ? ?Home Medications ?Prior to Admission medications   ?Medication Sig Start Date End Date Taking? Authorizing Provider  ?carbamide peroxide (DEBROX) 6.5 % OTIC solution Place 5 drops into both ears 2 (two) times daily. 12/12/17   Pisciotta, Joni Reining, PA-C  ?cyclobenzaprine (FLEXERIL) 10 MG tablet Take 1 tablet (10 mg total) by mouth 3 (three) times daily as needed for muscle spasms. 03/25/16   Beers, Charmayne Sheer, PA-C  ?ibuprofen (ADVIL,MOTRIN) 800 MG tablet Take 1 tablet (800 mg total) by mouth every 8 (eight) hours as needed. 03/25/16   Evangeline Dakin, PA-C  ?   ? ?Allergies    ?Patient has no known allergies.   ? ?Review of Systems   ?Review of Systems  ?Constitutional: Negative.   ?HENT: Negative.    ?Respiratory: Negative.    ?Cardiovascular: Negative.   ?Gastrointestinal: Negative.   ?Genitourinary:  Positive for penile discharge. Negative for decreased urine volume, difficulty urinating, dysuria, flank pain, frequency, hematuria, penile pain, penile swelling, scrotal swelling, testicular pain and urgency.  ?Musculoskeletal: Negative.   ?All other systems reviewed and are negative. ? ?Physical Exam ?Updated Vital Signs ?BP (!) 166/114 (BP Location: Right Arm)   Pulse 93   Temp 99.3 ?F (37.4 ?C) (Oral)   Resp 16   SpO2 99%  ?Physical Exam ?Vitals and nursing note reviewed.   ?Constitutional:   ?   General: He is not in acute distress. ?   Appearance: He is well-developed. He is not ill-appearing, toxic-appearing or diaphoretic.  ?HENT:  ?   Head: Normocephalic and atraumatic.  ?   Nose: Nose normal.  ?Eyes:  ?   Pupils: Pupils are equal, round, and reactive to light.  ?Cardiovascular:  ?   Rate and Rhythm: Normal rate and regular rhythm.  ?Pulmonary:  ?   Effort: Pulmonary effort is normal. No respiratory distress.  ?Abdominal:  ?   General: There is no distension.  ?   Palpations: Abdomen is soft.  ?Genitourinary: ?   Comments: Declined GU exam ?Musculoskeletal:     ?   General: Normal range of motion.  ?   Cervical back: Normal range of motion and neck supple.  ?Skin: ?   General: Skin is warm and dry.  ?Neurological:  ?   General: No focal deficit present.  ?   Mental Status: He is alert and oriented to person, place, and time.  ? ? ?ED Results / Procedures / Treatments   ?Labs ?(all labs ordered are listed, but only abnormal results are displayed) ?Labs Reviewed  ?URINALYSIS, ROUTINE W REFLEX MICROSCOPIC - Abnormal; Notable for the following components:  ?    Result Value  ? APPearance CLOUDY (*)   ? Hgb urine dipstick SMALL (*)   ? Leukocytes,Ua MODERATE (*)   ? WBC, UA >50 (*)   ? Bacteria,  UA RARE (*)   ? All other components within normal limits  ?RPR  ?HIV ANTIBODY (ROUTINE TESTING W REFLEX)  ?GC/CHLAMYDIA PROBE AMP ([place]) NOT AT [facility]  ? ? ?EKG ?None ? ?Radiology ?No results found. ? ?Procedures ?Procedures  ? ? ?Medications Ordered in ED ?Medications  ?metroNIDAZOLE (FLAGYL) tablet 2,000 mg (has no administration in time range)  ?azithromycin (ZITHROMAX) tablet 1,000 mg (has no administration in time range)  ?cefTRIAXone (ROCEPHIN) injection 500 mg (has no administration in time range)  ?ondansetron (ZOFRAN-ODT) disintegrating tablet 4 mg (has no administration in time range)  ?lidocaine (PF) (XYLOCAINE) 1 % injection (has no administration in time range)  ? ? ?ED  Course/ Medical Decision Making/ A&P ?  ? ?31 year old here for evaluation of exposure to STD.  Did note some scant yellow discharge to penis earlier today.  No pain with bowel movements.  He denies any rashes or lesions.  No swelling, pain to scrotum.  No abdominal pain.  Patient declined GU exam.  States he is just here to be tested and treated for STDs.  He is unsure what significant other tested positive for however does sound like this was trichomonas.  Given reported yellow discharge I discussed empiric antibiotics for gonorrhea/chlamydia which patient is agreeable to.  He understands he will need to follow-up for definitive results for gonorrhea, chlamydia, HIV and syphilis testing. ? ?Labs personally viewed and interpreted: ?Urine with moderate leuks, rare bacteria, greater than 20 WBC ? ?Patient given medication for empiric treatment of STD.  He will return for any worsening symptoms.  He will follow-up with remaining pending results ? ?Patient is afebrile without abdominal tenderness, abdominal pain or painful bowel movements to indicate prostatitis.  Denies pain to testes or epididymis to suggest orchitis or epididymitis.  STD cultures obtained including HIV, syphilis, gonorrhea and chlamydia.  ? ?The patient has been appropriately medically screened and/or stabilized in the ED. I have low suspicion for any other emergent medical condition which would require further screening, evaluation or treatment in the ED or require inpatient management. ? ?Patient is hemodynamically stable and in no acute distress.  Patient able to ambulate in department prior to ED.  Evaluation does not show acute pathology that would require ongoing or additional emergent interventions while in the emergency department or further inpatient treatment.  I have discussed the diagnosis with the patient and answered all questions.  Pain is been managed while in the emergency department and patient has no further complaints prior to  discharge.  Patient is comfortable with plan discussed in room and is stable for discharge at this time.  I have discussed strict return precautions for returning to the emergency department.  Patient was encouraged to follow-up with PCP/specialist refer to at discharge.  ? ?                        ?Medical Decision Making ?Amount and/or Complexity of Data Reviewed ?Labs: ordered. Decision-making details documented in ED Course. ? ?Risk ?OTC drugs. ?Prescription drug management. ?Risk Details: Do not feel patient needs additional labs, imaging, hospitalization at this time ? ? ? ? ? ? ? ? ?Final Clinical Impression(s) / ED Diagnoses ?Final diagnoses:  ?Penile discharge  ?Concern about STD in male without diagnosis  ? ? ?Rx / DC Orders ?ED Discharge Orders   ? ? None  ? ?  ? ? ?  ?Donovon Micheletti A, PA-C ?11/17/21 1813 ? ?  ?Wyvonnia Dusky,  MD ?11/17/21 1821 ? ?

## 2021-11-17 NOTE — Discharge Instructions (Addendum)
Your STD results are pending however we have treated you for gonorrhea, chlamydia and trichomonas if your results are positive for any of these infections. ? ?Your HIV and syphilis test is pending at discharge.  This does take approximately 2 to 3 days to result.  You will be called if this is positive ? ?Follow-up with health department for regular STD screening ?

## 2021-11-17 NOTE — ED Notes (Signed)
RN reviewed discharge instructions w/ pt. Follow up reviewed, pt had no further questions 

## 2021-11-18 LAB — RPR: RPR Ser Ql: NONREACTIVE

## 2021-11-19 LAB — GC/CHLAMYDIA PROBE AMP (~~LOC~~) NOT AT ARMC
Chlamydia: POSITIVE — AB
Comment: NEGATIVE
Comment: NORMAL
Neisseria Gonorrhea: POSITIVE — AB

## 2022-07-10 IMAGING — DX DG WRIST COMPLETE 3+V*R*
4 series · 4 of 4 positions shown · non-contrast
Comparison: Right forearm x-rays dated January 24, 2019.

CLINICAL DATA: Right wrist pain and swelling since hitting it on an
elevator door a week ago.

EXAM:
RIGHT WRIST - COMPLETE 3+ VIEW

[wrist pa (1 of 3)]
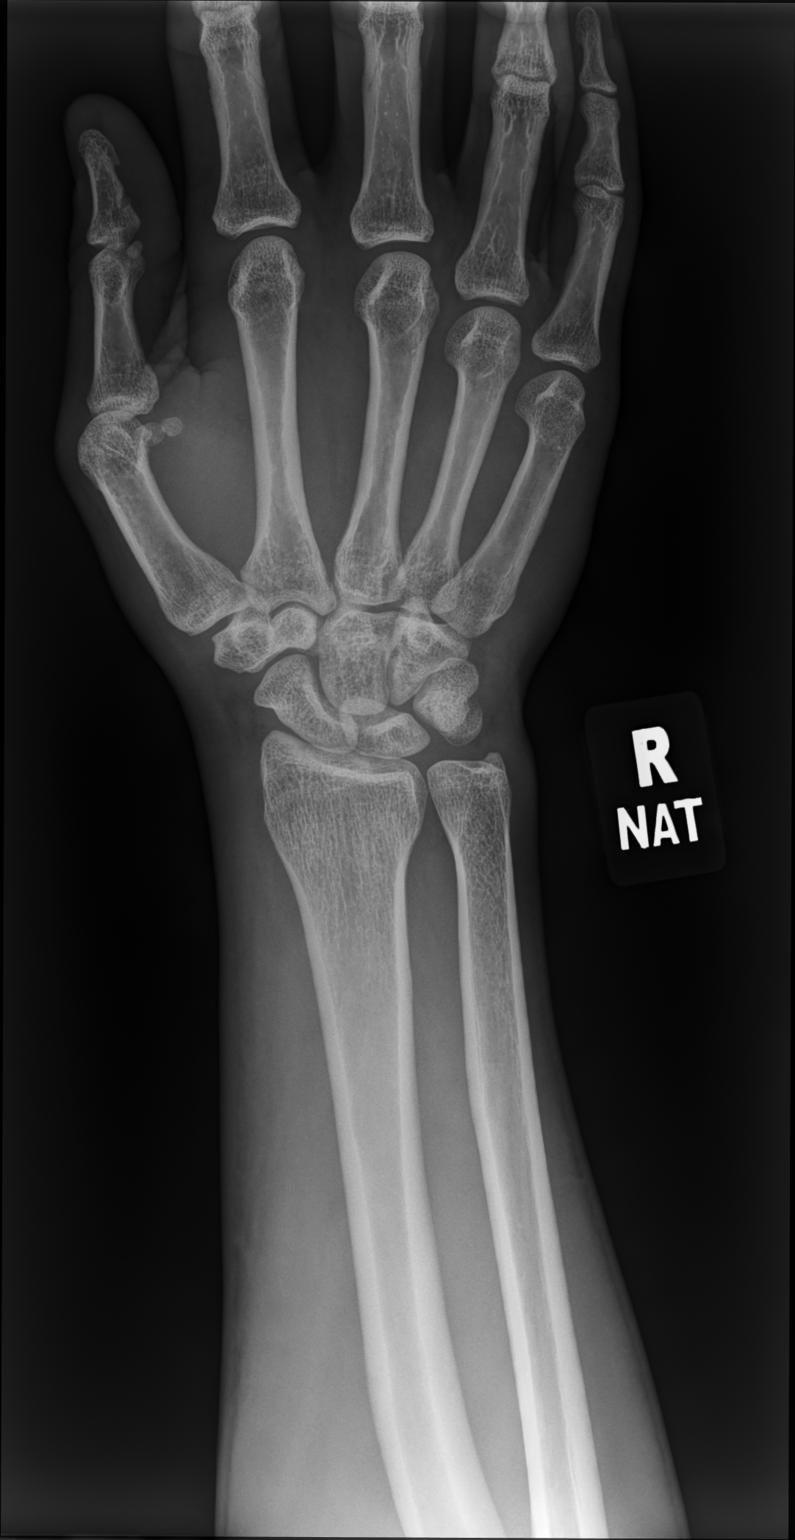

[wrist pa (2 of 3)]
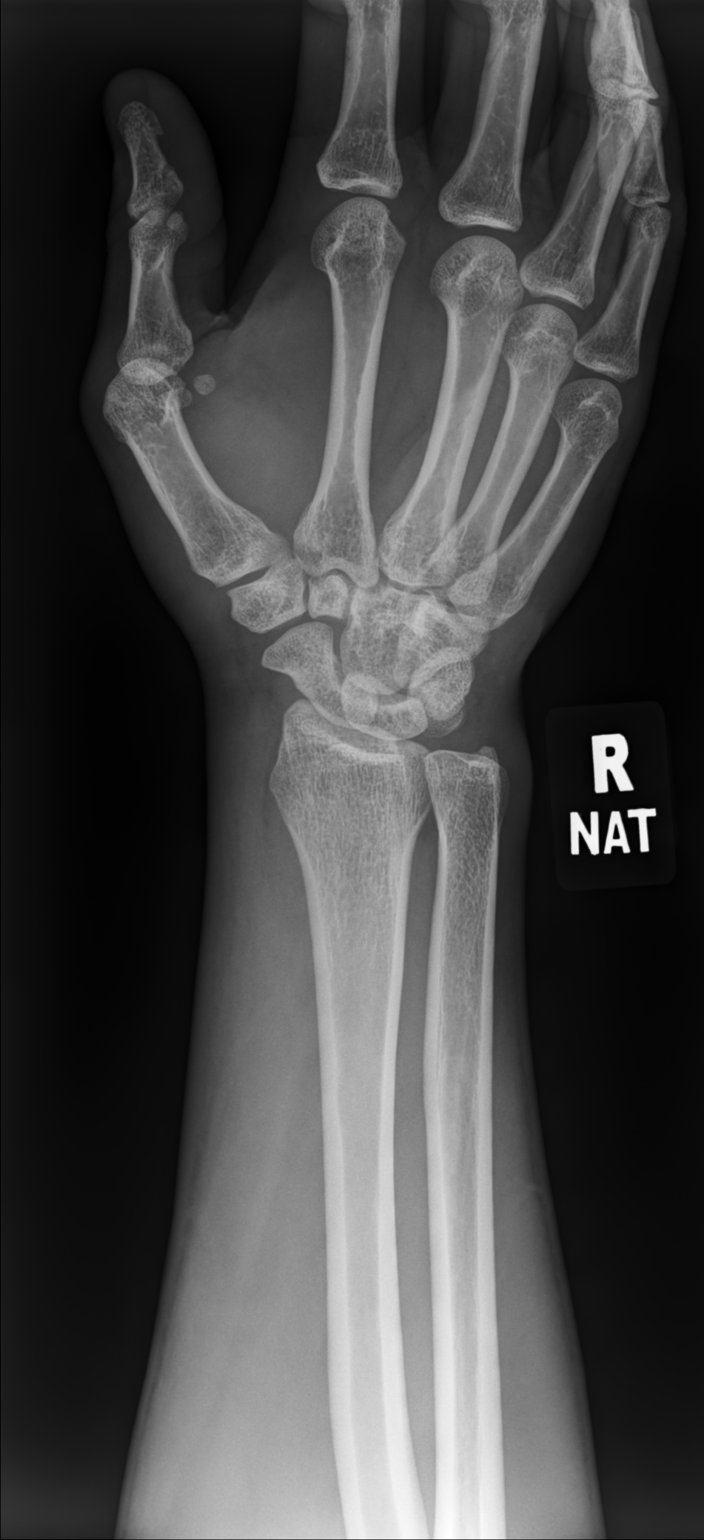

[wrist lat]
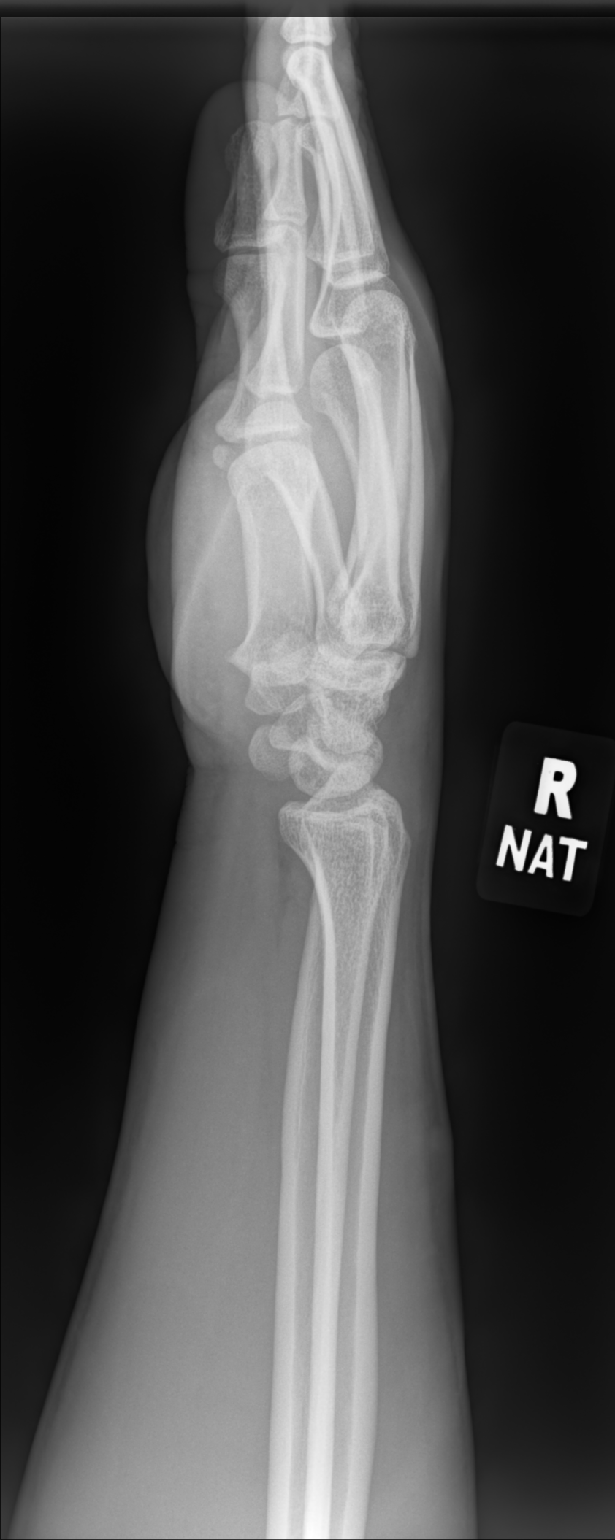

[wrist pa (3 of 3)]
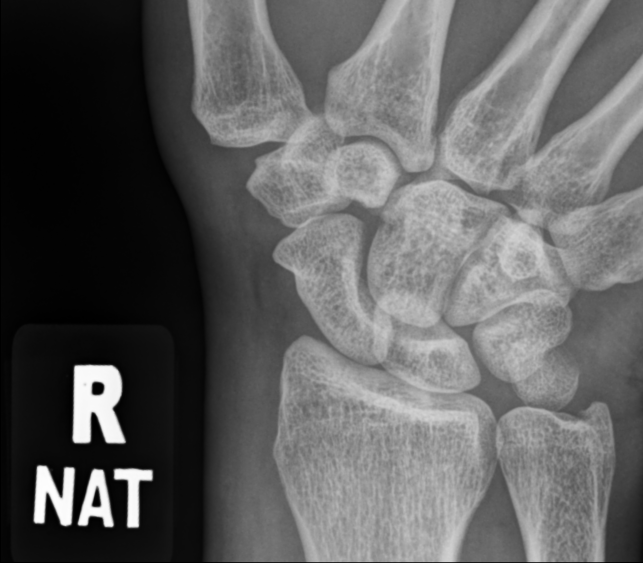

[4 of 4 positions shown; findings below may reference images not displayed]

FINDINGS: There is no evidence of fracture or dislocation. There is no
evidence of arthropathy or other focal bone abnormality. Soft
tissues are unremarkable.
IMPRESSION: Negative.

## 2023-10-16 ENCOUNTER — Encounter: Payer: Self-pay | Admitting: Family

## 2023-10-17 NOTE — Progress Notes (Signed)
   This encounter was created in error - please disregard. No show

## 2023-12-19 ENCOUNTER — Encounter: Payer: Self-pay | Admitting: Emergency Medicine

## 2023-12-19 ENCOUNTER — Ambulatory Visit
Admission: EM | Admit: 2023-12-19 | Discharge: 2023-12-19 | Disposition: A | Discharge status: Home / Self Care | Attending: Internal Medicine | Admitting: Internal Medicine

## 2023-12-19 DIAGNOSIS — Z202 Contact with and (suspected) exposure to infections with a predominantly sexual mode of transmission: Secondary | ICD-10-CM | POA: Insufficient documentation

## 2023-12-19 DIAGNOSIS — R519 Headache, unspecified: Secondary | ICD-10-CM | POA: Insufficient documentation

## 2023-12-19 DIAGNOSIS — R197 Diarrhea, unspecified: Secondary | ICD-10-CM | POA: Insufficient documentation

## 2023-12-19 DIAGNOSIS — R030 Elevated blood-pressure reading, without diagnosis of hypertension: Secondary | ICD-10-CM | POA: Insufficient documentation

## 2023-12-19 DIAGNOSIS — Z113 Encounter for screening for infections with a predominantly sexual mode of transmission: Secondary | ICD-10-CM | POA: Insufficient documentation

## 2023-12-19 DIAGNOSIS — R112 Nausea with vomiting, unspecified: Secondary | ICD-10-CM | POA: Diagnosis not present

## 2023-12-19 MED ORDER — ONDANSETRON 4 MG PO TBDP: 4.0000 mg | ORAL_TABLET | Freq: Three times a day (TID) | ORAL | 0 refills | Status: DC | PRN

## 2023-12-19 NOTE — Discharge Instructions (Addendum)
 STD testing is pending.  We will call if it is abnormal.  Please ensure adequate fluids and rest.  Follow-up if any symptoms persist or worsen.  Monitor your blood pressure closely at home and follow-up with PCP or urgent care if it remains elevated.

## 2023-12-19 NOTE — ED Triage Notes (Addendum)
 Pt here for STD testing - swab and blood work. Asymptomatic and no known exposures.   Also reports he woke up last night with N/V and diarrhea. 1 emesis episode total. 6 loose watery stools. Able to keep ginger ale and gatorade down since last emesis episode. Reports partner's son had stomach bug symptoms a few days ago.

## 2023-12-19 NOTE — ED Provider Notes (Signed)
 [facility]    CSN: [id number] Arrival date & time: 12/19/23  1413      History   Chief Complaint Chief Complaint  Patient presents with   Exposure to STD   Emesis   Diarrhea    HPI Lance Williams is a 33 y.o. male.   Patient presents with 2 different chief complaints today.  Patient requesting STD testing.  He denies confirmed exposure to STD but reports that he did have unprotected sexual intercourse recently.  Denies any associated symptoms.  Patient also reporting nausea, vomiting, diarrhea that started about 4 AM this morning.  Denies blood in stool or emesis.  He has been able to drink Gatorade and ginger ale today.  Reports his significant other's son recently had similar symptoms.  Denies fever but does report chills.  Blood pressure is also significantly elevated.  Reports that he previously took blood pressure medication when he was in middle school but they took him off of it given he was having side effects.  He does check his blood pressure at home and states that it is typically high.  He intermittently has headaches but is not reporting any chest pain, shortness of breath, blurred vision, dizziness, nausea, vomiting.   Exposure to STD  Emesis Diarrhea   Past Medical History:  Diagnosis Date   Hypertension     Patient Active Problem List   Diagnosis Date Noted   ELEVATED BLOOD PRESSURE 08/02/2009   BEE STING 06/28/2008   ACNE, MILD 03/18/2007    History reviewed. No pertinent surgical history.     Home Medications    Prior to Admission medications   Medication Sig Start Date End Date Taking? Authorizing Provider  ondansetron (ZOFRAN-ODT) 4 MG disintegrating tablet Take 1 tablet (4 mg total) by mouth every 8 (eight) hours as needed for nausea or vomiting. 12/19/23  Yes Pernell Dikes, Rolly Salter E, FNP  acetaminophen (TYLENOL) 325 MG tablet Take by mouth. 02/01/20   [provider]  carbamide peroxide (DEBROX) 6.5 % OTIC solution Place 5 drops  into both ears 2 (two) times daily. Patient not taking: Reported on 12/19/2023 12/12/17   Pisciotta, Joni Reining, PA-C  cyclobenzaprine (FLEXERIL) 10 MG tablet Take 1 tablet (10 mg total) by mouth 3 (three) times daily as needed for muscle spasms. Patient not taking: Reported on 12/19/2023 03/25/16   Evangeline Dakin, PA-C  ibuprofen (ADVIL) 600 MG tablet Take by mouth. 02/01/20   [provider]  ibuprofen (ADVIL,MOTRIN) 800 MG tablet Take 1 tablet (800 mg total) by mouth every 8 (eight) hours as needed. 03/25/16   Beers, Charmayne Sheer, PA-C    Family History History reviewed. No pertinent family history.  Social History Social History   Tobacco Use   Smoking status: Some Days    Types: Cigars   Smokeless tobacco: Never  Vaping Use   Vaping status: Some Days   Substances: Flavoring  Substance Use Topics   Alcohol use: No   Drug use: No     Allergies   Patient has no known allergies.   Review of Systems Review of Systems Per HPI  Physical Exam Triage Vital Signs ED Triage Vitals  Encounter Vitals Group     BP 12/19/23 1535 (!) 162/109     Systolic BP Percentile --      Diastolic BP Percentile --      Pulse Rate 12/19/23 1535 72     Resp 12/19/23 1535 16     Temp 12/19/23 1535 97.7 F (  36.5 C)     Temp Source 12/19/23 1535 Oral     SpO2 12/19/23 1535 97 %     Weight --      Height --      Head Circumference --      Peak Flow --      Pain Score 12/19/23 1536 0     Pain Loc --      Pain Education --      Exclude from Growth Chart --    No data found.  Updated Vital Signs BP (!) 154/109 (BP Location: Right Arm)   Pulse 72   Temp 97.7 F (36.5 C) (Oral)   Resp 16   SpO2 97%   Visual Acuity Right Eye Distance:   Left Eye Distance:   Bilateral Distance:    Right Eye Near:   Left Eye Near:    Bilateral Near:     Physical Exam Constitutional:      General: He is not in acute distress.    Appearance: Normal appearance. He is not toxic-appearing or  diaphoretic.  HENT:     Head: Normocephalic and atraumatic.     Mouth/Throat:     Mouth: Mucous membranes are moist.     Pharynx: No posterior oropharyngeal erythema.  Eyes:     Extraocular Movements: Extraocular movements intact.     Conjunctiva/sclera: Conjunctivae normal.  Cardiovascular:     Rate and Rhythm: Normal rate and regular rhythm.     Pulses: Normal pulses.     Heart sounds: Normal heart sounds.  Pulmonary:     Effort: Pulmonary effort is normal. No respiratory distress.     Breath sounds: Normal breath sounds.  Abdominal:     General: Bowel sounds are normal. There is no distension.     Palpations: Abdomen is soft.     Tenderness: There is no abdominal tenderness.  Genitourinary:    Comments: Deferred with shared decision making.  Self swab performed. Neurological:     General: No focal deficit present.     Mental Status: He is alert and oriented to person, place, and time. Mental status is at baseline.  Psychiatric:        Mood and Affect: Mood normal.        Behavior: Behavior normal.        Thought Content: Thought content normal.        Judgment: Judgment normal.      UC Treatments / Results  Labs (all labs ordered are listed, but only abnormal results are displayed) Labs Reviewed  RPR  HIV ANTIBODY (ROUTINE TESTING W REFLEX)  CYTOLOGY, (ORAL, ANAL, URETHRAL) ANCILLARY ONLY    EKG   Radiology No results found.  Procedures Procedures (including critical care time)  Medications Ordered in UC Medications - No data to display  Initial Impression / Assessment and Plan / UC Course  I have reviewed the triage vital signs and the nursing notes.  Pertinent labs & imaging results that were available during my care of the patient were reviewed by me and considered in my medical decision making (see chart for details).     1.  STD testing  Cytology swab, HIV, RPR test pending.  Given no confirmed exposure to STD, awaiting results prior to  treatment.  2.  Nausea, vomiting, diarrhea  Suspect viral illness given known sick contact with similar symptoms.  There are no signs of dehydration or acute abdomen on exam.  Will prescribe ondansetron to take as needed.  Advised adequate fluids, rest, bland diet.  Advised strict follow-up precautions.  Patient verbalized understanding and was agreeable with plan.  3. Elevated blood pressure reading  Recommended starting blood pressure medication today but patient declined wishing for PCP evaluation first.  PCP appointment was made for patient by clinical staff.  Advised patient to monitor blood pressure very closely over the next few days and follow-up with PCP or urgent care if it remains elevated.  No signs of hypertensive urgency on exam.  Patient verbalized understanding and was agreeable with plan. Final Clinical Impressions(s) / UC Diagnoses   Final diagnoses:  Nausea vomiting and diarrhea  Screening examination for venereal disease  Elevated blood pressure reading     Discharge Instructions      STD testing is pending.  We will call if it is abnormal.  Please ensure adequate fluids and rest.  Follow-up if any symptoms persist or worsen.  Monitor your blood pressure closely at home and follow-up with PCP or urgent care if it remains elevated.    ED Prescriptions     Medication Sig Dispense Auth. Provider   ondansetron (ZOFRAN-ODT) 4 MG disintegrating tablet Take 1 tablet (4 mg total) by mouth every 8 (eight) hours as needed for nausea or vomiting. 20 tablet [place], Acie Fredrickson, [place]      PDMP not reviewed this encounter.   Gustavus Bryant, [place] 12/19/23 [phone]

## 2023-12-20 LAB — HIV ANTIBODY (ROUTINE TESTING W REFLEX): HIV Screen 4th Generation wRfx: NONREACTIVE

## 2023-12-20 LAB — RPR: RPR Ser Ql: NONREACTIVE

## 2023-12-22 ENCOUNTER — Telehealth (HOSPITAL_COMMUNITY): Payer: Self-pay

## 2023-12-22 LAB — CYTOLOGY, (ORAL, ANAL, URETHRAL) ANCILLARY ONLY
Chlamydia: NEGATIVE
Comment: NEGATIVE
Comment: NEGATIVE
Comment: NORMAL
Neisseria Gonorrhea: NEGATIVE
Trichomonas: POSITIVE — AB

## 2023-12-22 MED ORDER — METRONIDAZOLE 500 MG PO TABS: 2000.0000 mg | ORAL_TABLET | Freq: Once | ORAL | 0 refills | Status: AC

## 2023-12-22 NOTE — Telephone Encounter (Signed)
 Per protocol, pt requires tx with metronidazole. Attempted to reach patient x1. LVM.  Rx sent to pharmacy on file.

## 2023-12-25 ENCOUNTER — Encounter: Payer: Self-pay | Admitting: Family Medicine

## 2023-12-25 ENCOUNTER — Ambulatory Visit (INDEPENDENT_AMBULATORY_CARE_PROVIDER_SITE_OTHER): Admitting: Family Medicine

## 2023-12-25 VITALS — BP 148/94 | HR 64 | Ht 69.0 in | Wt 205.0 lb

## 2023-12-25 DIAGNOSIS — Z0000 Encounter for general adult medical examination without abnormal findings: Secondary | ICD-10-CM

## 2023-12-25 DIAGNOSIS — Z1159 Encounter for screening for other viral diseases: Secondary | ICD-10-CM | POA: Diagnosis not present

## 2023-12-25 DIAGNOSIS — I10 Essential (primary) hypertension: Secondary | ICD-10-CM

## 2023-12-25 MED ORDER — BLOOD PRESSURE KIT: 1.0000 | PACK | Freq: Every day | 0 refills | Status: AC

## 2023-12-25 MED ORDER — AMLODIPINE BESYLATE 5 MG PO TABS: 5.0000 mg | ORAL_TABLET | Freq: Every day | ORAL | 3 refills | Status: AC

## 2023-12-25 NOTE — Assessment & Plan Note (Signed)
 Blood pressure is not at goal for age and co-morbidities.   Recommendations: start amlodipine 5 mg daily, follow-up in 2-3 weeks - BP goal <130/80 - monitor and log blood pressures at home - check around the same time each day in a relaxed setting - Limit salt to <2000 mg/day - Follow DASH eating plan (heart healthy diet) - limit alcohol to 2 standard drinks per day for men and 1 per day for women - avoid tobacco products - get at least 2 hours of regular aerobic exercise weekly Patient aware of signs/symptoms requiring further/urgent evaluation. Labs updated today.

## 2023-12-25 NOTE — Progress Notes (Signed)
 New Patient Office Visit  Subjective    Patient ID: Lance Williams, male    DOB: 1991-03-31  Age: 33 y.o. MRN: [id number]  CC:  Chief Complaint  Patient presents with   Establish Care    HPI Lance Williams presents to establish care  Discussed the use of AI scribe software for clinical note transcription with the patient, who gave verbal consent to proceed.  History of Present Illness Lance Williams is a 33 year old male with a history of high blood pressure who presents for evaluation of hypertension.  He has a history of high blood pressure, initially diagnosed in middle school. He was medicated at that time but discontinued the medication due to side effects. He does not recall the specific medication used and has not been on any antihypertensive medication recently.  He experiences occasional symptoms that he associates with high blood pressure, including a 'twingy feeling' on the side of his head and rare chest tightness. However, most days he feels fine and is unaware of his blood pressure being elevated.  He has had multiple encounters with different providers, including emergency department visits, where his blood pressure was consistently noted to be high. He has not had recent blood work to evaluate for underlying issues such as high cholesterol.  He lives with his uncle and works at [employer] and [employer]. He uses alcohol occasionally and vapes daily, having switched from smoking 'blacks' to vaping as a means to quit smoking. He has no known medication allergies and is not currently taking any medications. No urinary issues, shortness of breath, or swelling. He reports no surgeries and has a family history of high blood pressure, though he is unsure of the specific medications his family members are on.    BP Readings from Last 3 Encounters:  12/25/23 (!) 148/94  12/19/23 (!) 154/109  11/17/21 (!) 166/114            Outpatient Encounter Medications as  of 12/25/2023  Medication Sig   amLODipine (NORVASC) 5 MG tablet Take 1 tablet (5 mg total) by mouth daily.   Blood Pressure KIT 1 each by Does not apply route daily.   [DISCONTINUED] acetaminophen (TYLENOL) 325 MG tablet Take by mouth.   [DISCONTINUED] carbamide peroxide (DEBROX) 6.5 % OTIC solution Place 5 drops into both ears 2 (two) times daily. (Patient not taking: Reported on 12/19/2023)   [DISCONTINUED] cyclobenzaprine (FLEXERIL) 10 MG tablet Take 1 tablet (10 mg total) by mouth 3 (three) times daily as needed for muscle spasms. (Patient not taking: Reported on 12/19/2023)   [DISCONTINUED] ibuprofen (ADVIL) 600 MG tablet Take by mouth.   [DISCONTINUED] ibuprofen (ADVIL,MOTRIN) 800 MG tablet Take 1 tablet (800 mg total) by mouth every 8 (eight) hours as needed.   [DISCONTINUED] ondansetron (ZOFRAN-ODT) 4 MG disintegrating tablet Take 1 tablet (4 mg total) by mouth every 8 (eight) hours as needed for nausea or vomiting.   No facility-administered encounter medications on file as of 12/25/2023.    Past Medical History:  Diagnosis Date   Hypertension     History reviewed. No pertinent surgical history.  Family History  Problem Relation Age of Onset   Hypertension Mother    Hypertension Maternal Grandmother    Hypertension Maternal Grandfather     Social History   Socioeconomic History   Marital status: Single    Spouse name: Not on file   Number of children: Not on file   Years of education: Not on file  Highest education level: Not on file  Occupational History   Not on file  Tobacco Use   Smoking status: Every Day    Types: E-cigarettes   Smokeless tobacco: Never  Vaping Use   Vaping status: Some Days   Substances: Flavoring  Substance and Sexual Activity   Alcohol use: Yes    Comment: weekends   Drug use: Not Currently   Sexual activity: Yes    Birth control/protection: None  Other Topics Concern   Not on file  Social History Narrative   Not on file   Social  Drivers of Health   Financial Resource Strain: Not on file  Food Insecurity: Not on file  Transportation Needs: Not on file  Physical Activity: Not on file  Stress: Not on file  Social Connections: Not on file  Intimate Partner Violence: Not on file    ROS All review of systems negative except what is listed in the HPI      Objective    BP (!) 148/94   Pulse 64   Ht 5\' 9"  (1.753 m)   Wt 205 lb (93 kg)   SpO2 98%   BMI 30.27 kg/m   Physical Exam Vitals reviewed.  Constitutional:      Appearance: Normal appearance.  Cardiovascular:     Rate and Rhythm: Normal rate and regular rhythm.     Heart sounds: Normal heart sounds.  Pulmonary:     Effort: Pulmonary effort is normal.     Breath sounds: Normal breath sounds.  Skin:    General: Skin is warm and dry.  Neurological:     Mental Status: He is alert and oriented to person, place, and time.  Psychiatric:        Mood and Affect: Mood normal.        Behavior: Behavior normal.        Thought Content: Thought content normal.        Judgment: Judgment normal.         Assessment & Plan:   Problem List Items Addressed This Visit       Active Problems   HTN (hypertension) - Primary (Chronic)   Blood pressure is not at goal for age and co-morbidities.   Recommendations: start amlodipine 5 mg daily, follow-up in 2-3 weeks - BP goal <130/80 - monitor and log blood pressures at home - check around the same time each day in a relaxed setting - Limit salt to <2000 mg/day - Follow DASH eating plan (heart healthy diet) - limit alcohol to 2 standard drinks per day for men and 1 per day for women - avoid tobacco products - get at least 2 hours of regular aerobic exercise weekly Patient aware of signs/symptoms requiring further/urgent evaluation. Labs updated today.      Relevant Medications   Blood Pressure KIT   amLODipine (NORVASC) 5 MG tablet   Other Relevant Orders   CBC with Differential/Platelet    Comprehensive metabolic panel with GFR   Lipid panel   TSH   Other Visit Diagnoses       Encounter for medical examination to establish care         Encounter for hepatitis C screening test for low risk patient       Relevant Orders   Hepatitis C antibody       Return in about 2 weeks (around 01/08/2024) for HTN follow-up.   Clayborne Dana, NP

## 2023-12-25 NOTE — Patient Instructions (Signed)
 Blood pressure is not at goal for age and co-morbidities.   Recommendations: start amlodipine 5 mg daily, follow-up in 2-3 weeks - BP goal <130/80 - monitor and log blood pressures at home - check around the same time each day in a relaxed setting - Limit salt to <2000 mg/day - Follow DASH eating plan (heart healthy diet) - limit alcohol to 2 standard drinks per day for men and 1 per day for women - avoid tobacco products - get at least 2 hours of regular aerobic exercise weekly Patient aware of signs/symptoms requiring further/urgent evaluation. Labs updated today.

## 2023-12-26 ENCOUNTER — Encounter: Payer: Self-pay | Admitting: Family Medicine

## 2023-12-26 LAB — COMPREHENSIVE METABOLIC PANEL WITH GFR
ALT: 16 U/L (ref 0–53)
AST: 23 U/L (ref 0–37)
Albumin: 4.4 g/dL (ref 3.5–5.2)
Alkaline Phosphatase: 81 U/L (ref 39–117)
BUN: 9 mg/dL (ref 6–23)
CO2: 27 meq/L (ref 19–32)
Calcium: 9.2 mg/dL (ref 8.4–10.5)
Chloride: 104 meq/L (ref 96–112)
Creatinine, Ser: 1.09 mg/dL (ref 0.40–1.50)
GFR: 89.94 mL/min (ref >60.00)
Glucose, Bld: 79 mg/dL (ref 70–99)
Potassium: 3.9 meq/L (ref 3.5–5.1)
Sodium: 139 meq/L (ref 135–145)
Total Bilirubin: 0.5 mg/dL (ref 0.2–1.2)
Total Protein: 7 g/dL (ref 6.0–8.3)

## 2023-12-26 LAB — LIPID PANEL
Cholesterol: 139 mg/dL (ref 0–200)
HDL: 52.9 mg/dL (ref >39.00)
LDL Cholesterol: 78 mg/dL (ref 0–99)
NonHDL: 85.69
Total CHOL/HDL Ratio: 3
Triglycerides: 37 mg/dL (ref 0.0–149.0)
VLDL: 7.4 mg/dL (ref 0.0–40.0)

## 2023-12-26 LAB — HEPATITIS C ANTIBODY: Hepatitis C Ab: NONREACTIVE

## 2023-12-26 LAB — CBC WITH DIFFERENTIAL/PLATELET
Basophils Absolute: 0 10*3/uL (ref 0.0–0.1)
Basophils Relative: 0.9 % (ref 0.0–3.0)
Eosinophils Absolute: 0.1 10*3/uL (ref 0.0–0.7)
Eosinophils Relative: 1.3 % (ref 0.0–5.0)
HCT: 42 % (ref 39.0–52.0)
Hemoglobin: 14.2 g/dL (ref 13.0–17.0)
Lymphocytes Relative: 39.7 % (ref 12.0–46.0)
Lymphs Abs: 1.6 10*3/uL (ref 0.7–4.0)
MCHC: 33.9 g/dL (ref 30.0–36.0)
MCV: 92.7 fl (ref 78.0–100.0)
Monocytes Absolute: 0.3 10*3/uL (ref 0.1–1.0)
Monocytes Relative: 8.5 % (ref 3.0–12.0)
Neutro Abs: 2 10*3/uL (ref 1.4–7.7)
Neutrophils Relative %: 49.6 % (ref 43.0–77.0)
Platelets: 344 10*3/uL (ref 150.0–400.0)
RBC: 4.52 Mil/uL (ref 4.22–5.81)
RDW: 12.7 % (ref 11.5–15.5)
WBC: 4 10*3/uL (ref 4.0–10.5)

## 2023-12-26 LAB — TSH: TSH: 1.91 u[IU]/mL (ref 0.35–5.50)

## 2024-07-06 ENCOUNTER — Encounter: Admitting: Family Medicine

## 2024-07-15 ENCOUNTER — Encounter: Admitting: Family Medicine

## 2024-07-15 DIAGNOSIS — Z113 Encounter for screening for infections with a predominantly sexual mode of transmission: Secondary | ICD-10-CM

## 2024-07-15 DIAGNOSIS — Z0000 Encounter for general adult medical examination without abnormal findings: Secondary | ICD-10-CM

## 2024-07-15 NOTE — Progress Notes (Incomplete)
 Complete physical exam  Patient: Lance Williams   DOB: 1991-01-22   32 y.o. Male  MRN: [id number]  Subjective:    No chief complaint on file.   Lance Williams is a 34 y.o. male who presents today for a complete physical exam. He reports consuming a {diet types:17450} diet. {types:19826} He generally feels {DESC; WELL/FAIRLY WELL/POORLY:18703}. He reports sleeping {DESC; WELL/FAIRLY WELL/POORLY:18703}. He {does/does not:200015} have additional problems to discuss today.   Currently lives with: *** Acute concerns or interim problems since last visit: ***  Chronic Problems  Hypertension: - Medications: Amlodipine  5 mg daily.  - Compliance: *** - Checking BP at home: *** - Denies any SOB, recurrent headaches, CP, vision changes, LE edema, dizziness, palpitations, or medication side effects. - Diet: *** - Exercise: *** - Stressors:   Vision concerns: *** Dental concerns: *** STD concerns: ***  ETOH use: *** Nicotine use: *** Recreational drugs/illegal substances: ***       Most recent fall risk assessment:    12/25/2023    2:57 PM  Fall Risk   Falls in the past year? 0  Number falls in past yr: 0  Injury with Fall? 0  Risk for fall due to : No Fall Risks  Follow up Falls evaluation completed     Most recent depression screenings:    12/25/2023    2:56 PM  PHQ 2/9 Scores  PHQ - 2 Score 2  PHQ- 9 Score 8          {History (Optional):23778}  Patient Care Team: Almarie Waddell NOVAK, NP as PCP - General (Family Medicine)   Outpatient Medications Prior to Visit  Medication Sig   amLODipine  (NORVASC ) 5 MG tablet Take 1 tablet (5 mg total) by mouth daily.   Blood Pressure KIT 1 each by Does not apply route daily.   No facility-administered medications prior to visit.    ROS All ROS negative except what is listed in the HPI.       Objective:     There were no vitals taken for this visit. {Vitals History (Optional):23777}  Physical Exam    No results found for any visits on 07/15/24. {Show previous labs (optional):23779}    Assessment & Plan:    Routine Health Maintenance and Physical Exam Discussed health promotion and safety including diet and exercise recommendations, dental health, and injury prevention. Tobacco cessation if applicable. Seat belts, sunscreen, smoke detectors, etc.    Immunization History  Administered Date(s) Administered   DTaP 11/11/1991, 04/13/1992, 07/20/1992, 03/29/1993   HIB (PRP-OMP) 11/11/1991, 04/13/1992, 07/20/1992, 03/29/1993   Hepatitis A, Ped/Adol-2 Dose 08/02/2009   Hepatitis B, PED/ADOLESCENT Dec 14, 1990, 11/11/1991, 04/13/1992   IPV 11/11/1991, 04/13/1992, 03/29/1993, 11/09/2009   MMR 12/18/1992   Meningococcal polysaccharide vaccine (MPSV4) 03/18/2007   Td 03/18/2007   Tdap 01/24/2019    Health Maintenance  Topic Date Due   HPV VACCINES (1 - 3-dose SCDM series) Never done   Influenza Vaccine  Never done   Pneumococcal Vaccine (1 of 2 - PCV) 12/23/2024 (Originally 09/11/2010)   COVID-19 Vaccine (1 - 2025-26 season) 07/05/2025 (Originally 05/17/2024)   DTaP/Tdap/Td (7 - Td or Tdap) 01/23/2029   Hepatitis B Vaccines 19-59 Average Risk  Completed   Hepatitis C Screening  Completed   HIV Screening  Completed   Meningococcal B Vaccine  Aged Out        Problem List Items Addressed This Visit   None Visit Diagnoses       Annual physical exam    -  Primary     Screen for STD (sexually transmitted disease)              PATIENT COUNSELING:   Encouraged smoking cessation.   Recommend that most people either abstain from alcohol or drink within safe limits (<=14/week and <=4 drinks/occasion for males, <=7/weeks and <= 3 drinks/occasion for females) and that the risk for alcohol disorders and other health effects rises proportionally with the number of drinks per week and how often a drinker exceeds daily limits.   Diet: Recommend to adjust caloric intake to maintain or  achieve ideal body weight, to reduce intake of dietary saturated fat and total fat, to limit sodium intake by avoiding high sodium foods and not adding table salt, and to maintain adequate dietary potassium and calcium preferably from fresh fruits, vegetables, and low-fat dairy products.   Emphasized the importance of regular exercise.  Injury prevention: Recommend seatbelts, safety helmets, smoke detector, etc..   Dental health: Recommend regular tooth brushing, flossing, and dental visits.       No follow-ups on file.     Waddell KATHEE Mon, NP  I,Emily Lagle,acting as a scribe for Waddell KATHEE Mon, NP.,have documented all relevant documentation on the behalf of Waddell KATHEE Mon, NP,as directed by  Waddell KATHEE Mon, NP while in the presence of Waddell KATHEE Mon, NP.   I, Waddell KATHEE Mon, NP, have reviewed all documentation for this visit. The documentation on 07/15/2024 for the exam, diagnosis, procedures, and orders are all accurate and complete.
